# Patient Record
Sex: Female | Born: 1975
Health system: Southern US, Community
[De-identification: ages and names within clinical notes are randomized; demographics above are authoritative.]

## PROBLEM LIST (undated history)

## (undated) ENCOUNTER — Inpatient Hospital Stay (HOSPITAL_COMMUNITY): Payer: Self-pay

## (undated) DIAGNOSIS — F410 Panic disorder [episodic paroxysmal anxiety] without agoraphobia: Secondary | ICD-10-CM

## (undated) DIAGNOSIS — R51 Headache: Secondary | ICD-10-CM

## (undated) DIAGNOSIS — Z789 Other specified health status: Secondary | ICD-10-CM

## (undated) DIAGNOSIS — R519 Headache, unspecified: Secondary | ICD-10-CM

## (undated) HISTORY — PX: OTHER SURGICAL HISTORY: SHX169

## (undated) HISTORY — DX: Other specified health status: Z78.9

---

## 1998-03-31 ENCOUNTER — Other Ambulatory Visit: Admission: RE | Admit: 1998-03-31 | Discharge: 1998-03-31 | Payer: Self-pay | Admitting: Obstetrics and Gynecology

## 1998-06-05 ENCOUNTER — Other Ambulatory Visit: Admission: RE | Admit: 1998-06-05 | Discharge: 1998-06-05 | Payer: Self-pay | Admitting: Obstetrics and Gynecology

## 1999-08-25 ENCOUNTER — Other Ambulatory Visit: Admission: RE | Admit: 1999-08-25 | Discharge: 1999-08-25 | Payer: Self-pay | Admitting: Obstetrics and Gynecology

## 2000-01-28 ENCOUNTER — Encounter: Admission: RE | Admit: 2000-01-28 | Discharge: 2000-01-28 | Payer: Self-pay | Admitting: Family Medicine

## 2000-01-28 ENCOUNTER — Encounter: Payer: Self-pay | Admitting: Family Medicine

## 2000-10-28 ENCOUNTER — Other Ambulatory Visit: Admission: RE | Admit: 2000-10-28 | Discharge: 2000-10-28 | Payer: Self-pay | Admitting: Obstetrics and Gynecology

## 2001-07-25 ENCOUNTER — Encounter: Payer: Self-pay | Admitting: Family Medicine

## 2001-07-25 ENCOUNTER — Encounter: Admission: RE | Admit: 2001-07-25 | Discharge: 2001-07-25 | Payer: Self-pay | Admitting: Family Medicine

## 2001-11-14 ENCOUNTER — Other Ambulatory Visit: Admission: RE | Admit: 2001-11-14 | Discharge: 2001-11-14 | Payer: Self-pay | Admitting: Obstetrics and Gynecology

## 2003-11-14 ENCOUNTER — Encounter: Admission: RE | Admit: 2003-11-14 | Discharge: 2003-11-14 | Payer: Self-pay | Admitting: Family Medicine

## 2015-09-17 LAB — OB RESULTS CONSOLE RUBELLA ANTIBODY, IGM: Rubella: IMMUNE

## 2015-09-17 LAB — OB RESULTS CONSOLE GC/CHLAMYDIA
Chlamydia: NEGATIVE
Gonorrhea: NEGATIVE

## 2015-09-17 LAB — OB RESULTS CONSOLE HIV ANTIBODY (ROUTINE TESTING): HIV: NONREACTIVE

## 2015-09-17 LAB — OB RESULTS CONSOLE ANTIBODY SCREEN: ANTIBODY SCREEN: NEGATIVE

## 2015-09-17 LAB — OB RESULTS CONSOLE RPR: RPR: NONREACTIVE

## 2015-09-17 LAB — OB RESULTS CONSOLE ABO/RH: RH Type: POSITIVE

## 2015-09-17 LAB — OB RESULTS CONSOLE HEPATITIS B SURFACE ANTIGEN: Hepatitis B Surface Ag: NEGATIVE

## 2015-12-28 NOTE — L&D Delivery Note (Signed)
Patient was C/C/+1 labored down for an hour  and then pushed for 120 minutes with epidural.   NSVD  female infant, Apgars 9,9, weight P.   The patient had a midline second degree episiotomy done to allow for expedited delivery, repaired with  2-0 vicryl R. Fundus was firm. EBL was expected amount. Placenta was delivered intact. Vagina was clear.  Baby was vigorous and doing skin to skin with mother.  Gloria [name] A

## 2016-02-24 ENCOUNTER — Inpatient Hospital Stay (HOSPITAL_COMMUNITY): Admission: AD | Admit: 2016-02-24 | Payer: Self-pay | Source: Ambulatory Visit | Admitting: Obstetrics and Gynecology

## 2016-03-05 LAB — OB RESULTS CONSOLE GBS: GBS: POSITIVE

## 2016-03-22 ENCOUNTER — Telehealth (HOSPITAL_COMMUNITY): Payer: Self-pay | Admitting: *Deleted

## 2016-03-22 ENCOUNTER — Encounter (HOSPITAL_COMMUNITY): Payer: Self-pay | Admitting: *Deleted

## 2016-03-22 NOTE — Telephone Encounter (Signed)
Preadmission screen  

## 2016-03-23 ENCOUNTER — Telehealth (HOSPITAL_COMMUNITY): Payer: Self-pay | Admitting: *Deleted

## 2016-03-23 ENCOUNTER — Encounter (HOSPITAL_COMMUNITY): Payer: Self-pay | Admitting: *Deleted

## 2016-03-23 NOTE — Telephone Encounter (Signed)
Preadmission screen  

## 2016-03-26 ENCOUNTER — Encounter (HOSPITAL_COMMUNITY): Payer: Self-pay

## 2016-03-26 ENCOUNTER — Inpatient Hospital Stay (HOSPITAL_COMMUNITY): Payer: BLUE CROSS/BLUE SHIELD | Admitting: Anesthesiology

## 2016-03-26 ENCOUNTER — Inpatient Hospital Stay (HOSPITAL_COMMUNITY)
Admission: AD | Admit: 2016-03-26 | Discharge: 2016-03-28 | DRG: 775 | Disposition: A | Discharge status: Home / Self Care | Payer: BLUE CROSS/BLUE SHIELD | Source: Ambulatory Visit | Attending: Obstetrics and Gynecology | Admitting: Obstetrics and Gynecology

## 2016-03-26 DIAGNOSIS — Z3A39 39 weeks gestation of pregnancy: Secondary | ICD-10-CM

## 2016-03-26 DIAGNOSIS — O99824 Streptococcus B carrier state complicating childbirth: Secondary | ICD-10-CM | POA: Diagnosis present

## 2016-03-26 LAB — CBC
HEMATOCRIT: 39.9 % (ref 36.0–46.0)
HEMOGLOBIN: 14.3 g/dL (ref 12.0–15.0)
MCH: 33.1 pg (ref 26.0–34.0)
MCHC: 35.8 g/dL (ref 30.0–36.0)
MCV: 92.4 fL (ref 78.0–100.0)
Platelets: 224 10*3/uL (ref 150–400)
RBC: 4.32 MIL/uL (ref 3.87–5.11)
RDW: 13.4 % (ref 11.5–15.5)
WBC: 10.8 10*3/uL — ABNORMAL HIGH (ref 4.0–10.5)

## 2016-03-26 LAB — TYPE AND SCREEN
ABO/RH(D): A POS
Antibody Screen: NEGATIVE

## 2016-03-26 LAB — ABO/RH: ABO/RH(D): A POS

## 2016-03-26 MED ORDER — DIPHENHYDRAMINE HCL 50 MG/ML IJ SOLN: 12.5000 mg | INTRAMUSCULAR | Status: DC | PRN

## 2016-03-26 MED ORDER — PENICILLIN G POTASSIUM 5000000 UNITS IJ SOLR
5.0000 10*6.[IU] | Freq: Once | INTRAVENOUS | Status: AC
  Administered 2016-03-26: 5 10*6.[IU] via INTRAVENOUS
  Filled 2016-03-26: qty 5

## 2016-03-26 MED ORDER — PHENYLEPHRINE 40 MCG/ML (10ML) SYRINGE FOR IV PUSH (FOR BLOOD PRESSURE SUPPORT)
80.0000 ug | PREFILLED_SYRINGE | INTRAVENOUS | Status: DC | PRN
  Filled 2016-03-26: qty 2
  Filled 2016-03-26: qty 20

## 2016-03-26 MED ORDER — FENTANYL 2.5 MCG/ML BUPIVACAINE 1/10 % EPIDURAL INFUSION (WH - ANES)
14.0000 mL/h | INTRAMUSCULAR | Status: DC | PRN
  Administered 2016-03-26 (×3): 14 mL/h via EPIDURAL
  Filled 2016-03-26 (×2): qty 125

## 2016-03-26 MED ORDER — ONDANSETRON HCL 4 MG/2ML IJ SOLN: 4.0000 mg | Freq: Four times a day (QID) | INTRAMUSCULAR | Status: DC | PRN

## 2016-03-26 MED ORDER — CITRIC ACID-SODIUM CITRATE 334-500 MG/5ML PO SOLN
30.0000 mL | ORAL | Status: DC | PRN
  Administered 2016-03-26 (×2): 30 mL via ORAL
  Filled 2016-03-26 (×2): qty 15

## 2016-03-26 MED ORDER — PHENYLEPHRINE 40 MCG/ML (10ML) SYRINGE FOR IV PUSH (FOR BLOOD PRESSURE SUPPORT)
80.0000 ug | PREFILLED_SYRINGE | INTRAVENOUS | Status: DC | PRN
  Filled 2016-03-26: qty 2

## 2016-03-26 MED ORDER — TERBUTALINE SULFATE 1 MG/ML IJ SOLN: 0.2500 mg | Freq: Once | INTRAMUSCULAR | Status: DC | PRN

## 2016-03-26 MED ORDER — LACTATED RINGERS IV SOLN
500.0000 mL | Freq: Once | INTRAVENOUS | Status: AC
  Administered 2016-03-26: 1000 mL via INTRAVENOUS

## 2016-03-26 MED ORDER — LIDOCAINE HCL (PF) 1 % IJ SOLN
30.0000 mL | INTRAMUSCULAR | Status: DC | PRN
  Administered 2016-03-27: 30 mL via SUBCUTANEOUS
  Filled 2016-03-26: qty 30

## 2016-03-26 MED ORDER — LACTATED RINGERS IV SOLN
INTRAVENOUS | Status: DC
  Administered 2016-03-26 (×2): via INTRAVENOUS

## 2016-03-26 MED ORDER — EPHEDRINE 5 MG/ML INJ
10.0000 mg | INTRAVENOUS | Status: DC | PRN
  Filled 2016-03-26: qty 2

## 2016-03-26 MED ORDER — OXYTOCIN 10 UNIT/ML IJ SOLN
1.0000 m[IU]/min | INTRAVENOUS | Status: DC
  Administered 2016-03-26: 2 m[IU]/min via INTRAVENOUS
  Filled 2016-03-26: qty 4

## 2016-03-26 MED ORDER — OXYTOCIN 10 UNIT/ML IJ SOLN: 2.5000 [IU]/h | INTRAVENOUS | Status: DC

## 2016-03-26 MED ORDER — ACETAMINOPHEN 325 MG PO TABS: 650.0000 mg | ORAL_TABLET | ORAL | Status: DC | PRN

## 2016-03-26 MED ORDER — LIDOCAINE HCL (PF) 1 % IJ SOLN
INTRAMUSCULAR | Status: DC | PRN
  Administered 2016-03-26 (×2): 4 mL via EPIDURAL

## 2016-03-26 MED ORDER — LACTATED RINGERS IV SOLN: 500.0000 mL | INTRAVENOUS | Status: DC | PRN

## 2016-03-26 MED ORDER — PENICILLIN G POTASSIUM 5000000 UNITS IJ SOLR
2.5000 10*6.[IU] | INTRAVENOUS | Status: DC
  Administered 2016-03-26 (×3): 2.5 10*6.[IU] via INTRAVENOUS
  Filled 2016-03-26 (×7): qty 2.5

## 2016-03-26 MED ORDER — OXYTOCIN BOLUS FROM INFUSION
500.0000 mL | INTRAVENOUS | Status: DC
  Administered 2016-03-27: 500 mL via INTRAVENOUS

## 2016-03-26 NOTE — Anesthesia Procedure Notes (Signed)
Epidural Patient location during procedure: OB Start time: 03/26/2016 11:36 AM  Staffing Anesthesiologist: Mal AmabileFOSTER, Terresa Marlett Performed by: anesthesiologist   Preanesthetic Checklist Completed: patient identified, site marked, surgical consent, pre-op evaluation, timeout performed, IV checked, risks and benefits discussed and monitors and equipment checked  Epidural Patient position: sitting Prep: site prepped and draped and DuraPrep Patient monitoring: continuous pulse ox and blood pressure Approach: midline Location: L4-L5 Injection technique: LOR air  Needle:  Needle type: Tuohy  Needle gauge: 17 G Needle length: 9 cm and 9 Needle insertion depth: 6 cm Catheter type: closed end flexible Catheter size: 19 Gauge Catheter at skin depth: 11 cm Test dose: negative and Other  Assessment Events: blood not aspirated, injection not painful, no injection resistance, negative IV test and no paresthesia  Additional Notes Patient identified. Risks and benefits discussed including failed block, incomplete  Pain control, post dural puncture headache, nerve damage, paralysis, blood pressure Changes, nausea, vomiting, reactions to medications-both toxic and allergic and post Partum back pain. All questions were answered. Patient expressed understanding and wished to proceed. Sterile technique was used throughout procedure. Epidural site was Dressed with sterile barrier dressing. No paresthesias, signs of intravascular injection Or signs of intrathecal spread were encountered.  Patient was more comfortable after the epidural was dosed. Please see RN's note for documentation of vital signs and FHR which are stable.

## 2016-03-26 NOTE — H&P (Signed)
40 y.o. 2428w0d  G1P0 comes in for induction at term AMA with favorable cervix.  Otherwise has good fetal movement and no bleeding.  Past Medical History  Diagnosis Date  . Medical history non-contributory     Past Surgical History  Procedure Laterality Date  . Groin surgery      for cat scratch fever    OB History  Gravida Para Term Preterm AB SAB TAB Ectopic Multiple Living  1             # Outcome Date GA Lbr Len/2nd Weight Sex Delivery Anes PTL Lv  1 Current               Social History   Social History  . Marital Status: Married    Spouse Name: N/A  . Number of Children: N/A  . Years of Education: N/A   Occupational History  . Not on file.   Social History Main Topics  . Smoking status: Never Smoker   . Smokeless tobacco: Never Used  . Alcohol Use: No  . Drug Use: No  . Sexual Activity: Yes   Other Topics Concern  . Not on file   Social History Narrative   Hydrocodone    Prenatal Transfer Tool  Maternal Diabetes: No Genetic Screening: Normal- low risk Female NIPT Maternal Ultrasounds/Referrals: Abnormal:  Findings:   Fetal Kidney Anomalies- pyelectasis early resolved to 5.2 mm Fetal Ultrasounds or other Referrals:  None Maternal Substance Abuse:  No Significant Maternal Medications:  None Significant Maternal Lab Results: None  Other PNC: uncomplicated.    Filed Vitals:   03/26/16 0801  BP: 140/96  Pulse: 98  Temp: 98.1 F (36.7 C)  Resp: 20     Lungs/Cor:  NAD Abdomen:  soft, gravid Ex:  no cords, erythema SVE:  2/70/-2120 FHTs:  120s, good STV, NST R Toco:  q occ   A/P   AMA term induction.  GBS POS- PCN.  Sonny Anthes A

## 2016-03-26 NOTE — Anesthesia Preprocedure Evaluation (Signed)

## 2016-03-27 ENCOUNTER — Encounter (HOSPITAL_COMMUNITY): Payer: Self-pay

## 2016-03-27 LAB — RPR: RPR: NONREACTIVE

## 2016-03-27 LAB — CBC
HCT: 36.6 % (ref 36.0–46.0)
HEMOGLOBIN: 12.8 g/dL (ref 12.0–15.0)
MCH: 32.5 pg (ref 26.0–34.0)
MCHC: 35 g/dL (ref 30.0–36.0)
MCV: 92.9 fL (ref 78.0–100.0)
PLATELETS: 197 10*3/uL (ref 150–400)
RBC: 3.94 MIL/uL (ref 3.87–5.11)
RDW: 13.5 % (ref 11.5–15.5)
WBC: 22 10*3/uL — AB (ref 4.0–10.5)

## 2016-03-27 MED ORDER — METHYLERGONOVINE MALEATE 0.2 MG PO TABS: 0.2000 mg | ORAL_TABLET | ORAL | Status: DC | PRN

## 2016-03-27 MED ORDER — SODIUM CHLORIDE 0.9% FLUSH: 3.0000 mL | INTRAVENOUS | Status: DC | PRN

## 2016-03-27 MED ORDER — METHYLERGONOVINE MALEATE 0.2 MG/ML IJ SOLN: 0.2000 mg | INTRAMUSCULAR | Status: DC | PRN

## 2016-03-27 MED ORDER — BENZOCAINE-MENTHOL 20-0.5 % EX AERO
1.0000 "application " | INHALATION_SPRAY | CUTANEOUS | Status: DC | PRN
  Administered 2016-03-27: 1 via TOPICAL
  Filled 2016-03-27: qty 56

## 2016-03-27 MED ORDER — IBUPROFEN 800 MG PO TABS
800.0000 mg | ORAL_TABLET | Freq: Three times a day (TID) | ORAL | Status: DC
  Administered 2016-03-27 – 2016-03-28 (×4): 800 mg via ORAL
  Filled 2016-03-27 (×4): qty 1

## 2016-03-27 MED ORDER — SIMETHICONE 80 MG PO CHEW: 80.0000 mg | CHEWABLE_TABLET | ORAL | Status: DC | PRN

## 2016-03-27 MED ORDER — SENNOSIDES-DOCUSATE SODIUM 8.6-50 MG PO TABS
2.0000 | ORAL_TABLET | ORAL | Status: DC
  Administered 2016-03-27: 2 via ORAL
  Filled 2016-03-27: qty 2

## 2016-03-27 MED ORDER — DIPHENHYDRAMINE HCL 25 MG PO CAPS: 25.0000 mg | ORAL_CAPSULE | Freq: Four times a day (QID) | ORAL | Status: DC | PRN

## 2016-03-27 MED ORDER — MEASLES, MUMPS & RUBELLA VAC ~~LOC~~ INJ
0.5000 mL | INJECTION | Freq: Once | SUBCUTANEOUS | Status: DC
Start: 2016-03-28 — End: 2016-03-28
  Filled 2016-03-27: qty 0.5

## 2016-03-27 MED ORDER — PRENATAL MULTIVITAMIN CH
1.0000 | ORAL_TABLET | Freq: Every day | ORAL | Status: DC
  Administered 2016-03-27 – 2016-03-28 (×2): 1 via ORAL
  Filled 2016-03-27 (×2): qty 1

## 2016-03-27 MED ORDER — ONDANSETRON HCL 4 MG PO TABS: 4.0000 mg | ORAL_TABLET | ORAL | Status: DC | PRN

## 2016-03-27 MED ORDER — OXYCODONE-ACETAMINOPHEN 5-325 MG PO TABS: 2.0000 | ORAL_TABLET | ORAL | Status: DC | PRN

## 2016-03-27 MED ORDER — FERROUS SULFATE 325 (65 FE) MG PO TABS
325.0000 mg | ORAL_TABLET | Freq: Two times a day (BID) | ORAL | Status: DC
  Administered 2016-03-27 – 2016-03-28 (×3): 325 mg via ORAL
  Filled 2016-03-27 (×3): qty 1

## 2016-03-27 MED ORDER — OXYCODONE-ACETAMINOPHEN 5-325 MG PO TABS: 1.0000 | ORAL_TABLET | ORAL | Status: DC | PRN

## 2016-03-27 MED ORDER — SODIUM CHLORIDE 0.9% FLUSH
3.0000 mL | Freq: Two times a day (BID) | INTRAVENOUS | Status: DC
  Administered 2016-03-27: 3 mL via INTRAVENOUS

## 2016-03-27 MED ORDER — DIBUCAINE 1 % RE OINT: 1.0000 "application " | TOPICAL_OINTMENT | RECTAL | Status: DC | PRN

## 2016-03-27 MED ORDER — LANOLIN HYDROUS EX OINT: TOPICAL_OINTMENT | CUTANEOUS | Status: DC | PRN

## 2016-03-27 MED ORDER — ZOLPIDEM TARTRATE 5 MG PO TABS: 5.0000 mg | ORAL_TABLET | Freq: Every evening | ORAL | Status: DC | PRN

## 2016-03-27 MED ORDER — ACETAMINOPHEN 325 MG PO TABS: 650.0000 mg | ORAL_TABLET | ORAL | Status: DC | PRN

## 2016-03-27 MED ORDER — WITCH HAZEL-GLYCERIN EX PADS: 1.0000 "application " | MEDICATED_PAD | CUTANEOUS | Status: DC | PRN

## 2016-03-27 MED ORDER — SODIUM CHLORIDE 0.9 % IV SOLN: 250.0000 mL | INTRAVENOUS | Status: DC | PRN

## 2016-03-27 MED ORDER — MAGNESIUM HYDROXIDE 400 MG/5ML PO SUSP: 30.0000 mL | ORAL | Status: DC | PRN

## 2016-03-27 MED ORDER — ONDANSETRON HCL 4 MG/2ML IJ SOLN: 4.0000 mg | INTRAMUSCULAR | Status: DC | PRN

## 2016-03-27 MED ORDER — TETANUS-DIPHTH-ACELL PERTUSSIS 5-2.5-18.5 LF-MCG/0.5 IM SUSP: 0.5000 mL | Freq: Once | INTRAMUSCULAR | Status: DC

## 2016-03-27 NOTE — Anesthesia Postprocedure Evaluation (Signed)
Anesthesia Post Note  Patient: Gloria [name]  Procedure(s) Performed: * No procedures listed *  Patient location during evaluation: Mother Baby Anesthesia Type: Epidural Level of consciousness: awake and alert and oriented Pain management: satisfactory to patient Vital Signs Assessment: post-procedure vital signs reviewed and stable Respiratory status: spontaneous breathing and nonlabored ventilation Cardiovascular status: stable Postop Assessment: no headache, no backache, no signs of nausea or vomiting, adequate PO intake and patient able to bend at knees (patient up walking) Anesthetic complications: no    Last Vitals:  Filed Vitals:   03/27/16 0308 03/27/16 0422  BP: 137/73 124/75  Pulse: 79 93  Temp: 36.7 C 37.2 C  Resp: 18 20    Last Pain:  Filed Vitals:   03/27/16 0428  PainSc: 0-No pain                 Davari Lopes

## 2016-03-27 NOTE — Discharge Summary (Signed)
Obstetric Discharge Summary Reason for Admission: induction of labor Prenatal Procedures: NST Intrapartum Procedures: spontaneous vaginal delivery Postpartum Procedures: none Complications-Operative and Postpartum: 2 degree perineal laceration HEMOGLOBIN  Date Value Ref Range Status  03/27/2016 12.8 12.0 - 15.0 g/dL Final   HCT  Date Value Ref Range Status  03/27/2016 36.6 36.0 - 46.0 % Final     Discharge Diagnoses: Term Pregnancy-delivered  Discharge Information: Date: 03/27/2016 Activity: pelvic rest Diet: routine Medications: Ibuprofen Condition: stable Instructions: refer to practice specific booklet Discharge to: home Follow-up Information    Follow up with Gloria Kilbride A, MD. Schedule an appointment as soon as possible for [name] visit in 4 weeks.   Specialty:  Obstetrics and Gynecology   Contact information:   [address] [place] [id number] [phone]       Newborn Data: Live born female  Birth Weight: 6 lb 14.2 oz (3125 g) APGAR: 9, 9  Home with mother.  Gloria [name] 03/27/2016, 9:51 AM

## 2016-03-27 NOTE — Lactation Note (Signed)
This note was copied from a baby's chart. Lactation Consultation Note  Patient Name: Boy Hassan Rowaniffany Morash WGNFA'OToday's Date: 03/27/2016 Reason for consult: Follow-up assessment Mom trying to latch baby in cradle hold but baby not obtaining good depth. LC assisted Mom with positioning in football hold, demonstrated how to use breast compression to help with latch. Baby developed a good suckling pattern with some swallowing motions noted. Mom denied discomfort with latch. Stressed to WES[employer] InternationalMom the importance of using pillows to support baby and keep close to breast, supporting breasts for baby to sustain good depth once latched to breasts.  Basic teaching reviewed. Advised to call for assist with latch if baby not obtaining good depth.   Maternal Data Has patient been taught Hand Expression?: Yes (Mom reports RN demonstrated hand expression) Does the patient have breastfeeding experience prior to this delivery?: No  Feeding Feeding Type: Breast Fed  LATCH Score/Interventions Latch: Repeated attempts needed to sustain latch, nipple held in mouth throughout feeding, stimulation needed to elicit sucking reflex. Intervention(s): Adjust position;Assist with latch;Breast massage;Breast compression  Audible Swallowing: A few with stimulation  Type of Nipple: Everted at rest and after stimulation (short nipple shafts bilateral, almost flat)  Comfort (Breast/Nipple): Soft / non-tender     Hold (Positioning): Assistance needed to correctly position infant at breast and maintain latch. Intervention(s): Breastfeeding basics reviewed;Support Pillows;Position options;Skin to skin  LATCH Score: 7  Lactation Tools Discussed/Used WIC Program: No   Consult Status Consult Status: Follow-up Date: 03/28/16 Follow-up type: In-patient    Alfred LevinsGranger, Zaraya Delauder Ann 03/27/2016, 5:42 PM

## 2016-03-27 NOTE — Lactation Note (Signed)
This note was copied from a baby's chart. Lactation Consultation Note  Patient Name: Gloria [name] Reason for consult: Initial assessment Mom reports she feels BF is going well so far. Basic teaching reviewed, encouraged to BF with feeding ques. Lactation brochure left for review, advised of OP services and support group. Encouraged to call with next feeding for latch check.   Maternal Data Has patient been taught Hand Expression?: Yes (Mom reports RN demonstrated hand expression) Does the patient have breastfeeding experience prior to this delivery?: No  Feeding Feeding Type: Breast Fed Length of feed: 25 min  LATCH Score/Interventions                      Lactation Tools Discussed/Used WIC Program: No   Consult Status Consult Status: Follow-up Date: 03/28/16 Follow-up type: In-patient    Gloria [name], Gloria [name] [name], 2:21 PM

## 2016-03-27 NOTE — Progress Notes (Signed)
Patient is eating, ambulating, voiding.  Pain control is good.  Filed Vitals:   03/27/16 0247 03/27/16 0308 03/27/16 0422 03/27/16 0830  BP: 114/76 137/73 124/75 138/74  Pulse: 95 79 93 84  Temp:  98.1 F (36.7 C) 98.9 F (37.2 C) 97.7 F (36.5 C)  TempSrc:  Oral Oral Oral  Resp: 20 18 20 18   Height:      Weight:      SpO2:  98% 97% 99%    Fundus firm Perineum without swelling.  Lab Results  Component Value Date   WBC 22.0* 03/27/2016   HGB 12.8 03/27/2016   HCT 36.6 03/27/2016   MCV 92.9 03/27/2016   PLT 197 03/27/2016    --/--/A POS, A POS (03/31 0845)/RI  A/P Post partum day 0.  Routine care.  Expect d/c routine.    Rexford Prevo A

## 2016-03-28 NOTE — Lactation Note (Signed)
This note was copied from a baby's chart. Lactation Consultation Note  Patient Name: Boy Hassan Rowaniffany Hinkle ZOXWR'UToday's Date: 03/28/2016 Reason for consult: Follow-up assessment   Follow up with first time mom of 32 hours of age. Infant with 10 BF for 10-40 minutes, 6 voids and 5 stools in last 24 hours. Infant weight 6 lb 8.2 oz with weight loss of 5% since birth.   Infant currently having circumcision. Discussed that infant may be sleepy today and to encourage him to feed every 2-3 hours if he is not cueing. Enc mom to continue feeding 8-12 x in 24 hours at first feeding cues. Mom denies nipple pain and says her breast feel a little heavier today.  Parents report they may be d/c later today. Reviewed all BF information in Taking Care of Baby and Me Booklet. Reviewed Engorgement prevention/treatment/comfort pumping. Mom has Medela PIS for home use. Reviewed I/O and enc parent to maintain feeding log and take to Ped visit. Infant to [facility] tomorrow.   Reviewed LC Brochure, mom aware of OP Services, BF Support Groups and LC phone #. Enc mom to call with questions/concerns prn. Parents without questions/concerns today.   Maternal Data Formula Feeding for Exclusion: No Does the patient have breastfeeding experience prior to this delivery?: No  Feeding Feeding Type: Breast Fed Length of feed: 10 min  LATCH Score/Interventions Latch: Repeated attempts needed to sustain latch, nipple held in mouth throughout feeding, stimulation needed to elicit sucking reflex. Intervention(s): Skin to skin  Audible Swallowing: Spontaneous and intermittent Intervention(s): Skin to skin  Type of Nipple: Everted at rest and after stimulation  Comfort (Breast/Nipple): Soft / non-tender     Hold (Positioning): No assistance needed to correctly position infant at breast.  LATCH Score: 9  Lactation Tools Discussed/Used Pump Review: Milk Storage;Setup, frequency, and cleaning   Consult  Status Consult Status: Complete Follow-up type: Call as needed    Ed BlalockSharon S Viraj Liby 03/28/2016, 10:07 AM

## 2016-03-28 NOTE — Progress Notes (Signed)
Patient is eating, ambulating, voiding.  Pain control is good.  Filed Vitals:   03/27/16 0830 03/27/16 1733 03/27/16 2137 03/28/16 0548  BP: 138/74 127/80 121/70 136/83  Pulse: 84 94 90 88  Temp: 97.7 F (36.5 C) 98.4 F (36.9 C) 98.4 F (36.9 C) 97.8 F (36.6 C)  TempSrc: Oral Oral Oral Oral  Resp: 18 18 18 18   Height:      Weight:      SpO2: 99% 98% 98% 99%    Fundus firm Perineum without swelling.  Lab Results  Component Value Date   WBC 22.0* 03/27/2016   HGB 12.8 03/27/2016   HCT 36.6 03/27/2016   MCV 92.9 03/27/2016   PLT 197 03/27/2016    --/--/A POS, A POS (03/31 0845)/RI  A/P Post partum day 1.  Routine care.  Expect d/c routine.    Isaac Lacson A

## 2016-12-27 NOTE — L&D Delivery Note (Signed)
Pt was admitted for induction. She was started on Pit.She had AROM with clear fluid. She progressed rapidly to C/C/+2. She had a SVD after one push. Baby delivered ROA. Nuchal cord x 1 Placenta-S/I . [facility]cc. [facility]

## 2017-02-02 LAB — OB RESULTS CONSOLE RPR: RPR: NONREACTIVE

## 2017-02-02 LAB — OB RESULTS CONSOLE GC/CHLAMYDIA
Chlamydia: NEGATIVE
GC PROBE AMP, GENITAL: NEGATIVE

## 2017-02-02 LAB — OB RESULTS CONSOLE ANTIBODY SCREEN: ANTIBODY SCREEN: NEGATIVE

## 2017-02-02 LAB — OB RESULTS CONSOLE HIV ANTIBODY (ROUTINE TESTING): HIV: NONREACTIVE

## 2017-02-02 LAB — OB RESULTS CONSOLE RUBELLA ANTIBODY, IGM: Rubella: UNDETERMINED

## 2017-02-02 LAB — OB RESULTS CONSOLE ABO/RH: RH Type: POSITIVE

## 2017-02-02 LAB — OB RESULTS CONSOLE HEPATITIS B SURFACE ANTIGEN: Hepatitis B Surface Ag: NEGATIVE

## 2017-03-31 ENCOUNTER — Emergency Department (HOSPITAL_COMMUNITY): Payer: Managed Care, Other (non HMO)

## 2017-03-31 ENCOUNTER — Observation Stay (HOSPITAL_COMMUNITY)
Admission: EM | Admit: 2017-03-31 | Discharge: 2017-04-01 | Disposition: A | Discharge status: Home / Self Care | Payer: Managed Care, Other (non HMO) | Attending: Obstetrics and Gynecology | Admitting: Obstetrics and Gynecology

## 2017-03-31 ENCOUNTER — Encounter (HOSPITAL_COMMUNITY): Payer: Self-pay | Admitting: *Deleted

## 2017-03-31 DIAGNOSIS — Z3A34 34 weeks gestation of pregnancy: Secondary | ICD-10-CM | POA: Diagnosis not present

## 2017-03-31 DIAGNOSIS — Y999 Unspecified external cause status: Secondary | ICD-10-CM | POA: Insufficient documentation

## 2017-03-31 DIAGNOSIS — Y92410 Unspecified street and highway as the place of occurrence of the external cause: Secondary | ICD-10-CM | POA: Insufficient documentation

## 2017-03-31 DIAGNOSIS — Y93I9 Activity, other involving external motion: Secondary | ICD-10-CM | POA: Diagnosis not present

## 2017-03-31 DIAGNOSIS — S8992XA Unspecified injury of left lower leg, initial encounter: Secondary | ICD-10-CM | POA: Diagnosis not present

## 2017-03-31 DIAGNOSIS — O9A213 Injury, poisoning and certain other consequences of external causes complicating pregnancy, third trimester: Principal | ICD-10-CM | POA: Insufficient documentation

## 2017-03-31 DIAGNOSIS — S1091XA Abrasion of unspecified part of neck, initial encounter: Secondary | ICD-10-CM | POA: Diagnosis not present

## 2017-03-31 LAB — COMPREHENSIVE METABOLIC PANEL
ALBUMIN: 2.9 g/dL — AB (ref 3.5–5.0)
ALK PHOS: 105 U/L (ref 38–126)
ALT: 16 U/L (ref 14–54)
AST: 20 U/L (ref 15–41)
Anion gap: 10 (ref 5–15)
CALCIUM: 9.1 mg/dL (ref 8.9–10.3)
CHLORIDE: 106 mmol/L (ref 101–111)
CO2: 19 mmol/L — AB (ref 22–32)
CREATININE: 0.46 mg/dL (ref 0.44–1.00)
GFR calc non Af Amer: >60 mL/min (ref >60)
GLUCOSE: 90 mg/dL (ref 65–99)
Potassium: 3.3 mmol/L — ABNORMAL LOW (ref 3.5–5.1)
Sodium: 135 mmol/L (ref 135–145)
Total Bilirubin: 0.5 mg/dL (ref 0.3–1.2)
Total Protein: 5.8 g/dL — ABNORMAL LOW (ref 6.5–8.1)

## 2017-03-31 LAB — CBC
HCT: 37.5 % (ref 36.0–46.0)
HEMOGLOBIN: 13 g/dL (ref 12.0–15.0)
MCH: 32.3 pg (ref 26.0–34.0)
MCHC: 34.7 g/dL (ref 30.0–36.0)
MCV: 93.1 fL (ref 78.0–100.0)
PLATELETS: 208 10*3/uL (ref 150–400)
RBC: 4.03 MIL/uL (ref 3.87–5.11)
RDW: 13.2 % (ref 11.5–15.5)
WBC: 13.5 10*3/uL — AB (ref 4.0–10.5)

## 2017-03-31 MED ORDER — LACTATED RINGERS IV BOLUS (SEPSIS)
1000.0000 mL | Freq: Once | INTRAVENOUS | Status: AC
  Administered 2017-03-31: 1000 mL via INTRAVENOUS

## 2017-03-31 MED ORDER — CALCIUM CARBONATE ANTACID 500 MG PO CHEW: 2.0000 | CHEWABLE_TABLET | ORAL | Status: DC | PRN

## 2017-03-31 MED ORDER — PRENATAL MULTIVITAMIN CH
1.0000 | ORAL_TABLET | Freq: Every day | ORAL | Status: DC
  Administered 2017-04-01: 1 via ORAL
  Filled 2017-03-31: qty 1

## 2017-03-31 MED ORDER — NIFEDIPINE 10 MG PO CAPS
10.0000 mg | ORAL_CAPSULE | Freq: Once | ORAL | Status: AC
  Administered 2017-03-31: 10 mg via ORAL

## 2017-03-31 MED ORDER — DOCUSATE SODIUM 100 MG PO CAPS
100.0000 mg | ORAL_CAPSULE | Freq: Every day | ORAL | Status: DC
  Administered 2017-04-01: 100 mg via ORAL
  Filled 2017-03-31: qty 1

## 2017-03-31 MED ORDER — ACETAMINOPHEN 325 MG PO TABS: 650.0000 mg | ORAL_TABLET | ORAL | Status: DC | PRN

## 2017-03-31 MED ORDER — ZOLPIDEM TARTRATE 5 MG PO TABS: 5.0000 mg | ORAL_TABLET | Freq: Every evening | ORAL | Status: DC | PRN

## 2017-03-31 MED ORDER — NIFEDIPINE 10 MG PO CAPS
10.0000 mg | ORAL_CAPSULE | Freq: Three times a day (TID) | ORAL | Status: DC
  Administered 2017-03-31 – 2017-04-01 (×5): 10 mg via ORAL
  Filled 2017-03-31 (×8): qty 1

## 2017-03-31 MED ORDER — NIFEDIPINE 10 MG PO CAPS: 10.0000 mg | ORAL_CAPSULE | Freq: Once | ORAL | Status: DC

## 2017-03-31 MED ORDER — SODIUM CHLORIDE 0.9 % IV BOLUS (SEPSIS): 1000.0000 mL | Freq: Once | INTRAVENOUS | Status: DC

## 2017-03-31 NOTE — ED Notes (Signed)
Pt denies feeling ucs.
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
## 2017-04-01 ENCOUNTER — Observation Stay (HOSPITAL_COMMUNITY): Payer: Managed Care, Other (non HMO)

## 2017-04-01 ENCOUNTER — Other Ambulatory Visit: Payer: Self-pay | Admitting: Obstetrics

## 2017-04-01 DIAGNOSIS — Z3A34 34 weeks gestation of pregnancy: Secondary | ICD-10-CM

## 2017-04-01 DIAGNOSIS — S8992XA Unspecified injury of left lower leg, initial encounter: Secondary | ICD-10-CM | POA: Diagnosis not present

## 2017-04-01 DIAGNOSIS — O9A213 Injury, poisoning and certain other consequences of external causes complicating pregnancy, third trimester: Secondary | ICD-10-CM

## 2017-04-01 DIAGNOSIS — S1091XA Abrasion of unspecified part of neck, initial encounter: Secondary | ICD-10-CM | POA: Diagnosis not present

## 2017-04-01 MED ORDER — NIFEDIPINE 10 MG PO CAPS
10.0000 mg | ORAL_CAPSULE | Freq: Once | ORAL | Status: AC
  Administered 2017-04-01: 10 mg via ORAL
  Filled 2017-04-01: qty 1

## 2017-04-01 MED ORDER — BETAMETHASONE SOD PHOS & ACET 6 (3-3) MG/ML IJ SUSP
12.0000 mg | INTRAMUSCULAR | Status: DC
  Administered 2017-04-01: 12 mg via INTRAMUSCULAR
  Filled 2017-04-01 (×2): qty 2

## 2017-04-01 MED ORDER — BACITRACIN-NEOMYCIN-POLYMYXIN 400-5-5000 EX OINT
TOPICAL_OINTMENT | Freq: Three times a day (TID) | CUTANEOUS | Status: DC
  Administered 2017-04-01: 1 via TOPICAL
  Filled 2017-04-01 (×4): qty 1

## 2017-04-01 MED ORDER — BETAMETHASONE SOD PHOS & ACET 6 (3-3) MG/ML IJ SUSP: 12.0000 mg | Freq: Once | INTRAMUSCULAR | Status: DC
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
## 2017-04-02 ENCOUNTER — Inpatient Hospital Stay (HOSPITAL_COMMUNITY)
Admission: AD | Admit: 2017-04-02 | Discharge: 2017-04-02 | Disposition: A | Discharge status: Home / Self Care | Payer: Managed Care, Other (non HMO) | Source: Ambulatory Visit | Attending: Obstetrics | Admitting: Obstetrics

## 2017-04-02 DIAGNOSIS — Z3490 Encounter for supervision of normal pregnancy, unspecified, unspecified trimester: Secondary | ICD-10-CM | POA: Insufficient documentation

## 2017-04-02 DIAGNOSIS — Z3A00 Weeks of gestation of pregnancy not specified: Secondary | ICD-10-CM | POA: Diagnosis not present

## 2017-04-02 MED ORDER — BETAMETHASONE SOD PHOS & ACET 6 (3-3) MG/ML IJ SUSP
12.0000 mg | Freq: Once | INTRAMUSCULAR | Status: AC
  Administered 2017-04-02: 12 mg via INTRAMUSCULAR
  Filled 2017-04-02: qty 2

## 2017-04-05 LAB — OB RESULTS CONSOLE GBS: STREP GROUP B AG: POSITIVE

## 2017-04-27 ENCOUNTER — Other Ambulatory Visit: Payer: Self-pay | Admitting: Obstetrics and Gynecology

## 2017-04-28 ENCOUNTER — Telehealth (HOSPITAL_COMMUNITY): Payer: Self-pay | Admitting: *Deleted

## 2017-04-28 NOTE — Telephone Encounter (Signed)
Preadmission screen  

## 2017-04-29 ENCOUNTER — Encounter (HOSPITAL_COMMUNITY): Payer: Self-pay | Admitting: *Deleted

## 2017-04-29 ENCOUNTER — Telehealth (HOSPITAL_COMMUNITY): Payer: Self-pay | Admitting: *Deleted

## 2017-04-29 NOTE — Telephone Encounter (Signed)
Preadmission screen  

## 2017-05-02 ENCOUNTER — Inpatient Hospital Stay (HOSPITAL_COMMUNITY): Payer: Managed Care, Other (non HMO) | Admitting: Anesthesiology

## 2017-05-02 ENCOUNTER — Inpatient Hospital Stay (HOSPITAL_COMMUNITY)
Admission: RE | Admit: 2017-05-02 | Discharge: 2017-05-03 | DRG: 775 | Disposition: A | Discharge status: Home / Self Care | Payer: Managed Care, Other (non HMO) | Source: Ambulatory Visit | Attending: Obstetrics and Gynecology | Admitting: Obstetrics and Gynecology

## 2017-05-02 ENCOUNTER — Encounter (HOSPITAL_COMMUNITY): Payer: Self-pay

## 2017-05-02 DIAGNOSIS — O09529 Supervision of elderly multigravida, unspecified trimester: Secondary | ICD-10-CM

## 2017-05-02 DIAGNOSIS — Z3493 Encounter for supervision of normal pregnancy, unspecified, third trimester: Secondary | ICD-10-CM | POA: Diagnosis present

## 2017-05-02 DIAGNOSIS — Z3A39 39 weeks gestation of pregnancy: Secondary | ICD-10-CM | POA: Diagnosis not present

## 2017-05-02 DIAGNOSIS — Z3490 Encounter for supervision of normal pregnancy, unspecified, unspecified trimester: Secondary | ICD-10-CM

## 2017-05-02 DIAGNOSIS — O99824 Streptococcus B carrier state complicating childbirth: Secondary | ICD-10-CM | POA: Diagnosis present

## 2017-05-02 LAB — CBC
HCT: 36.5 % (ref 36.0–46.0)
Hemoglobin: 12.7 g/dL (ref 12.0–15.0)
MCH: 32.1 pg (ref 26.0–34.0)
MCHC: 34.8 g/dL (ref 30.0–36.0)
MCV: 92.2 fL (ref 78.0–100.0)
Platelets: 219 10*3/uL (ref 150–400)
RBC: 3.96 MIL/uL (ref 3.87–5.11)
RDW: 13.2 % (ref 11.5–15.5)
WBC: 9.2 10*3/uL (ref 4.0–10.5)

## 2017-05-02 LAB — TYPE AND SCREEN
ABO/RH(D): A POS
Antibody Screen: NEGATIVE

## 2017-05-02 LAB — RPR: RPR Ser Ql: NONREACTIVE

## 2017-05-02 MED ORDER — OXYTOCIN 40 UNITS IN LACTATED RINGERS INFUSION - SIMPLE MED
2.5000 [IU]/h | INTRAVENOUS | Status: DC
  Administered 2017-05-02: 2.5 [IU]/h via INTRAVENOUS

## 2017-05-02 MED ORDER — PENICILLIN G POT IN DEXTROSE 60000 UNIT/ML IV SOLN
3.0000 10*6.[IU] | INTRAVENOUS | Status: DC
  Administered 2017-05-02 (×2): 3 10*6.[IU] via INTRAVENOUS
  Filled 2017-05-02 (×3): qty 50

## 2017-05-02 MED ORDER — BUPIVACAINE IN DEXTROSE 0.75-8.25 % IT SOLN
INTRATHECAL | Status: AC
  Filled 2017-05-02: qty 2

## 2017-05-02 MED ORDER — PENICILLIN G POTASSIUM 5000000 UNITS IJ SOLR
5.0000 10*6.[IU] | Freq: Once | INTRAVENOUS | Status: AC
  Administered 2017-05-02: 5 10*6.[IU] via INTRAVENOUS
  Filled 2017-05-02: qty 5

## 2017-05-02 MED ORDER — EPHEDRINE 5 MG/ML INJ
10.0000 mg | INTRAVENOUS | Status: DC | PRN
  Filled 2017-05-02: qty 2

## 2017-05-02 MED ORDER — LACTATED RINGERS IV SOLN: 500.0000 mL | INTRAVENOUS | Status: DC | PRN

## 2017-05-02 MED ORDER — TERBUTALINE SULFATE 1 MG/ML IJ SOLN
0.2500 mg | Freq: Once | INTRAMUSCULAR | Status: DC | PRN
  Filled 2017-05-02: qty 1

## 2017-05-02 MED ORDER — ONDANSETRON HCL 4 MG/2ML IJ SOLN: 4.0000 mg | INTRAMUSCULAR | Status: DC | PRN

## 2017-05-02 MED ORDER — IBUPROFEN 600 MG PO TABS
600.0000 mg | ORAL_TABLET | Freq: Four times a day (QID) | ORAL | Status: DC
  Administered 2017-05-02 – 2017-05-03 (×4): 600 mg via ORAL
  Filled 2017-05-02 (×3): qty 1

## 2017-05-02 MED ORDER — MISOPROSTOL 25 MCG QUARTER TABLET
25.0000 ug | ORAL_TABLET | ORAL | Status: DC | PRN
  Filled 2017-05-02: qty 1

## 2017-05-02 MED ORDER — LIDOCAINE HCL (PF) 1 % IJ SOLN
30.0000 mL | INTRAMUSCULAR | Status: DC | PRN
  Filled 2017-05-02: qty 30

## 2017-05-02 MED ORDER — SOD CITRATE-CITRIC ACID 500-334 MG/5ML PO SOLN: 30.0000 mL | ORAL | Status: DC | PRN

## 2017-05-02 MED ORDER — OXYTOCIN 40 UNITS IN LACTATED RINGERS INFUSION - SIMPLE MED
1.0000 m[IU]/min | INTRAVENOUS | Status: DC
  Administered 2017-05-02: 2 m[IU]/min via INTRAVENOUS
  Filled 2017-05-02: qty 1000

## 2017-05-02 MED ORDER — TETANUS-DIPHTH-ACELL PERTUSSIS 5-2.5-18.5 LF-MCG/0.5 IM SUSP: 0.5000 mL | Freq: Once | INTRAMUSCULAR | Status: DC

## 2017-05-02 MED ORDER — MEASLES, MUMPS & RUBELLA VAC ~~LOC~~ INJ
0.5000 mL | INJECTION | Freq: Once | SUBCUTANEOUS | Status: AC
  Administered 2017-05-03: 0.5 mL via SUBCUTANEOUS
  Filled 2017-05-02: qty 0.5

## 2017-05-02 MED ORDER — COCONUT OIL OIL: 1.0000 "application " | TOPICAL_OIL | Status: DC | PRN

## 2017-05-02 MED ORDER — BENZOCAINE-MENTHOL 20-0.5 % EX AERO: 1.0000 "application " | INHALATION_SPRAY | CUTANEOUS | Status: DC | PRN

## 2017-05-02 MED ORDER — PHENYLEPHRINE 40 MCG/ML (10ML) SYRINGE FOR IV PUSH (FOR BLOOD PRESSURE SUPPORT)
80.0000 ug | PREFILLED_SYRINGE | INTRAVENOUS | Status: DC | PRN
  Filled 2017-05-02: qty 5

## 2017-05-02 MED ORDER — DIBUCAINE 1 % RE OINT: 1.0000 "application " | TOPICAL_OINTMENT | RECTAL | Status: DC | PRN

## 2017-05-02 MED ORDER — LACTATED RINGERS IV SOLN
500.0000 mL | Freq: Once | INTRAVENOUS | Status: AC
  Administered 2017-05-02: 500 mL via INTRAVENOUS

## 2017-05-02 MED ORDER — DIPHENHYDRAMINE HCL 50 MG/ML IJ SOLN: 12.5000 mg | INTRAMUSCULAR | Status: DC | PRN

## 2017-05-02 MED ORDER — SENNOSIDES-DOCUSATE SODIUM 8.6-50 MG PO TABS
2.0000 | ORAL_TABLET | ORAL | Status: DC
  Administered 2017-05-03: 2 via ORAL
  Filled 2017-05-02: qty 2

## 2017-05-02 MED ORDER — WITCH HAZEL-GLYCERIN EX PADS: 1.0000 "application " | MEDICATED_PAD | CUTANEOUS | Status: DC | PRN

## 2017-05-02 MED ORDER — LACTATED RINGERS IV SOLN
INTRAVENOUS | Status: DC
  Administered 2017-05-02: 01:00:00 via INTRAVENOUS

## 2017-05-02 MED ORDER — ONDANSETRON HCL 4 MG PO TABS: 4.0000 mg | ORAL_TABLET | ORAL | Status: DC | PRN

## 2017-05-02 MED ORDER — FENTANYL 2.5 MCG/ML BUPIVACAINE 1/10 % EPIDURAL INFUSION (WH - ANES)
14.0000 mL/h | INTRAMUSCULAR | Status: DC | PRN
  Filled 2017-05-02: qty 100

## 2017-05-02 MED ORDER — ONDANSETRON HCL 4 MG/2ML IJ SOLN: 4.0000 mg | Freq: Four times a day (QID) | INTRAMUSCULAR | Status: DC | PRN

## 2017-05-02 MED ORDER — PHENYLEPHRINE 40 MCG/ML (10ML) SYRINGE FOR IV PUSH (FOR BLOOD PRESSURE SUPPORT)
80.0000 ug | PREFILLED_SYRINGE | INTRAVENOUS | Status: DC | PRN
  Filled 2017-05-02: qty 5
  Filled 2017-05-02: qty 10

## 2017-05-02 MED ORDER — ACETAMINOPHEN 325 MG PO TABS: 650.0000 mg | ORAL_TABLET | ORAL | Status: DC | PRN

## 2017-05-02 MED ORDER — OXYTOCIN BOLUS FROM INFUSION
500.0000 mL | Freq: Once | INTRAVENOUS | Status: AC
  Administered 2017-05-02: 500 mL via INTRAVENOUS

## 2017-05-02 MED ORDER — BUPIVACAINE IN DEXTROSE 0.75-8.25 % IT SOLN
INTRATHECAL | Status: DC | PRN
  Administered 2017-05-02: 1.2 mL via INTRATHECAL

## 2017-05-02 MED ORDER — SIMETHICONE 80 MG PO CHEW: 80.0000 mg | CHEWABLE_TABLET | ORAL | Status: DC | PRN

## 2017-05-02 MED ORDER — ZOLPIDEM TARTRATE 5 MG PO TABS: 5.0000 mg | ORAL_TABLET | Freq: Every evening | ORAL | Status: DC | PRN

## 2017-05-02 NOTE — Anesthesia Pain Management Evaluation Note (Signed)
  CRNA Pain Management Visit Note  Patient: Gloria [name], 41 y.o., female  "Hello I am a member of the anesthesia team at [facility]. We have an anesthesia team available at all times to provide care throughout the hospital, including epidural management and anesthesia for C-section. I don't know your plan for the delivery whether it a natural birth, water birth, IV sedation, nitrous supplementation, doula or epidural, but we want to meet your pain goals."   1.Was your pain managed to your expectations on prior hospitalizations?   Yes   2.What is your expectation for pain management during this hospitalization?     Epidural  3.How can we help you reach that goal? Epidural when ready.  Record the patient's initial score and the patient's pain goal.   Pain: 5  Pain Goal: 5 [facility] wants you to be able to say your pain was always managed very well.  Lakeidra Reliford L 05/02/2017

## 2017-05-02 NOTE — Anesthesia Postprocedure Evaluation (Signed)
Anesthesia Post Note  Patient: Gloria [name]  Procedure(s) Performed: * No procedures listed *  Patient location during evaluation: Mother Baby Anesthesia Type: Epidural Level of consciousness: awake and alert and oriented Pain management: satisfactory to patient Vital Signs Assessment: post-procedure vital signs reviewed and stable Respiratory status: spontaneous breathing and nonlabored ventilation Cardiovascular status: stable Postop Assessment: no headache, no backache, no signs of nausea or vomiting, adequate PO intake and patient able to bend at knees (patient up walking) Anesthetic complications: no        Last Vitals:  Vitals:   05/02/17 1245 05/02/17 1345  BP: 136/60 136/72  Pulse: 62 62  Resp: 18 16  Temp: 36.4 C 36.5 C    Last Pain:  Vitals:   05/02/17 1345  TempSrc: Oral  PainSc:    Pain Goal:                 Rewa Weissberg

## 2017-05-02 NOTE — Anesthesia Procedure Notes (Signed)
Spinal  Patient location during procedure: OR Start time: 05/02/2017 9:53 AM End time: 05/02/2017 9:55 AM Staffing Anesthesiologist: Leilani AbleHATCHETT, Kaito Schulenburg Performed: anesthesiologist  Preanesthetic Checklist Completed: patient identified, surgical consent, pre-op evaluation, timeout performed, IV checked, risks and benefits discussed and monitors and equipment checked Spinal Block Patient position: sitting Prep: site prepped and draped and DuraPrep Patient monitoring: heart rate, cardiac monitor, continuous pulse ox and blood pressure Approach: midline Location: L3-4 Injection technique: single-shot Needle Needle type: Pencan  Needle gauge: 24 G Needle length: 9 cm Needle insertion depth: 7 cm Assessment Sensory level: T10

## 2017-05-02 NOTE — Anesthesia Preprocedure Evaluation (Signed)
Anesthesia Evaluation  Patient identified by MRN, date of birth, ID band Patient awake    Reviewed: Allergy & Precautions, H&P , NPO status , Patient's Chart, lab work & pertinent test results  Airway Mallampati: I  TM Distance: >3 FB Neck ROM: full    Dental no notable dental hx.    Pulmonary neg pulmonary ROS,    Pulmonary exam normal        Cardiovascular negative cardio ROS Normal cardiovascular exam     Neuro/Psych negative neurological ROS  negative psych ROS   GI/Hepatic negative GI ROS, Neg liver ROS,   Endo/Other  negative endocrine ROS  Renal/GU negative Renal ROS  negative genitourinary   Musculoskeletal negative musculoskeletal ROS (+)   Abdominal (+) + obese,   Peds  Hematology negative hematology ROS (+)   Anesthesia Other Findings   Reproductive/Obstetrics (+) Pregnancy                             Anesthesia Physical Anesthesia Plan  ASA: II  Anesthesia Plan: Epidural   Post-op Pain Management:    Induction:   Airway Management Planned:   Additional Equipment:   Intra-op Plan:   Post-operative Plan:   Informed Consent: I have reviewed the patients History and Physical, chart, labs and discussed the procedure including the risks, benefits and alternatives for the proposed anesthesia with the patient or authorized representative who has indicated his/her understanding and acceptance.     Plan Discussed with:   Anesthesia Plan Comments:         Anesthesia Quick Evaluation  

## 2017-05-02 NOTE — H&P (Signed)
41 y.o. 7735w0d  G2P1001 comes in for induction AMA.  Otherwise has good fetal movement and no bleeding.  Past Medical History:  Diagnosis Date  . Medical history non-contributory     Past Surgical History:  Procedure Laterality Date  . groin surgery     for cat scratch fever    OB History  Gravida Para Term Preterm AB Living  2 1 1     1   SAB TAB Ectopic Multiple Live Births        0 1    # Outcome Date GA Lbr Len/2nd Weight Sex Delivery Anes PTL Lv  2 Current           1 Term 03/27/16 8021w1d / 03:43 6 lb 14.2 oz (3.125 kg) M Vag-Spont EPI, Local  LIV     Birth Comments: bruise noted to lower right base of head      Social History   Social History  . Marital status: Married    Spouse name: N/A  . Number of children: N/A  . Years of education: N/A   Occupational History  . Not on file.   Social History Main Topics  . Smoking status: Never Smoker  . Smokeless tobacco: Never Used  . Alcohol use No  . Drug use: No  . Sexual activity: Yes   Other Topics Concern  . Not on file   Social History Narrative  . No narrative on file   Hydrocodone    Prenatal Transfer Tool  Maternal Diabetes: No Genetic Screening: Declined- PNC too late for most screening Maternal Ultrasounds/Referrals: Normal Fetal Ultrasounds or other Referrals:  None Maternal Substance Abuse:  No Significant Maternal Medications:  None Significant Maternal Lab Results: None  Other [facility].    There were no vitals filed for this visit.   Lungs/Cor:  NAD Abdomen:  soft, gravid Ex:  no cords, erythema SVE:  3/20/-2 in office FHTs:  All normal in office, good STV, NST R Toco:  q occ   A/P   AMA induction at 39 weeks.  GBS POS- PCN.  Adam Sanjuan A

## 2017-05-03 NOTE — Discharge Summary (Signed)
Obstetric Discharge Summary Reason for Admission: induction of labor and AMA Prenatal Procedures: ultrasound Intrapartum Procedures: spontaneous vaginal delivery Postpartum Procedures: none Complications-Operative and Postpartum: none Hemoglobin  Date Value Ref Range Status  05/02/2017 12.7 12.0 - 15.0 g/dL Final   HCT  Date Value Ref Range Status  05/02/2017 36.5 36.0 - 46.0 % Final    Physical Exam:  General: alert and cooperative Lochia: appropriate Uterine Fundus: firm DVT Evaluation: No evidence of DVT seen on physical exam.  Discharge Diagnoses: Term Pregnancy-delivered  Discharge Information: Date: 05/03/2017 Activity: pelvic rest Diet: routine Medications: PNV and Ibuprofen Condition: stable Instructions: refer to practice specific booklet Discharge to: home Follow-up Information    Gloria [name], Gloria E, MD Follow up in 4 week(s).   Specialty:  Obstetrics and Gynecology Contact information: [address] [place] [id number] [phone]           Newborn Data: Live born female  Birth Weight: 6 lb 6.8 oz (2915 g) APGAR: 8, 9  Home with mother.  Gloria [name], [name] 05/03/2017, 12:25 PM

## 2017-07-27 ENCOUNTER — Other Ambulatory Visit (HOSPITAL_COMMUNITY): Payer: Self-pay | Admitting: Obstetrics and Gynecology

## 2017-07-27 NOTE — H&P (Signed)
41 y.o. yo [id number] desires permanent sterilization.  Past Medical History:  Diagnosis Date  . Medical history non-contributory    Past Surgical History:  Procedure Laterality Date  . groin surgery     for cat scratch fever    Social History   Social History  . Marital status: Married    Spouse name: N/Gloria [name]  . Number of children: N/Gloria [name]  . Years of education: N/Gloria [name]   Occupational History  . Not on file.   Social History Main Topics  . Smoking status: Never Smoker  . Smokeless tobacco: Never Used  . Alcohol use No  . Drug use: No  . Sexual activity: Yes   Other Topics Concern  . Not on file   Social History Narrative  . No narrative on file    No current facility-administered medications on file prior to encounter.    Current Outpatient Prescriptions on File Prior to Encounter  Medication Sig Dispense Refill  . calcium carbonate (TUMS - DOSED IN MG ELEMENTAL CALCIUM) 500 MG chewable tablet Chew 1 tablet by mouth daily as needed for indigestion or heartburn.    . Prenatal Vit-Fe Fumarate-FA (PRENATAL MULTIVITAMIN) TABS tablet Take 1 tablet by mouth daily at 12 noon.      Allergies  Allergen Reactions  . Hydrocodone Hives    @VITALS2 @  Lungs: clear to ascultation Cor:  RRR Abdomen:  soft, nontender, nondistended. Ex:  no cords, erythema Pelvic:  Normal EFG, normal s/s uterus  Gloria [name]:  For laparscopic filschie clip BTL.   P: All risks, benefits and alternatives d/w patient and she desires to proceed.  Patient will have SCDs during the operation.     Gloria Gloria [name]

## 2017-08-01 ENCOUNTER — Encounter (HOSPITAL_COMMUNITY): Payer: Self-pay

## 2017-08-03 ENCOUNTER — Ambulatory Visit (HOSPITAL_COMMUNITY): Payer: Managed Care, Other (non HMO) | Admitting: Anesthesiology

## 2017-08-03 ENCOUNTER — Encounter (HOSPITAL_COMMUNITY): Payer: Self-pay

## 2017-08-03 ENCOUNTER — Encounter (HOSPITAL_COMMUNITY)
Admission: RE | Disposition: A | Discharge status: Home / Self Care | Payer: Self-pay | Source: Ambulatory Visit | Attending: Obstetrics and Gynecology

## 2017-08-03 ENCOUNTER — Ambulatory Visit (HOSPITAL_COMMUNITY)
Admission: RE | Admit: 2017-08-03 | Discharge: 2017-08-03 | Disposition: A | Discharge status: Home / Self Care | Payer: Managed Care, Other (non HMO) | Source: Ambulatory Visit | Attending: Obstetrics and Gynecology | Admitting: Obstetrics and Gynecology

## 2017-08-03 DIAGNOSIS — Z885 Allergy status to narcotic agent status: Secondary | ICD-10-CM | POA: Diagnosis not present

## 2017-08-03 DIAGNOSIS — Z302 Encounter for sterilization: Secondary | ICD-10-CM | POA: Insufficient documentation

## 2017-08-03 DIAGNOSIS — F419 Anxiety disorder, unspecified: Secondary | ICD-10-CM | POA: Diagnosis not present

## 2017-08-03 HISTORY — DX: Headache, unspecified: R51.9

## 2017-08-03 HISTORY — DX: Panic disorder (episodic paroxysmal anxiety): F41.0

## 2017-08-03 HISTORY — PX: LAPAROSCOPIC TUBAL LIGATION: SHX1937

## 2017-08-03 HISTORY — DX: Headache: R51

## 2017-08-03 LAB — CBC
HCT: 47.2 % — ABNORMAL HIGH (ref 36.0–46.0)
Hemoglobin: 16 g/dL — ABNORMAL HIGH (ref 12.0–15.0)
MCH: 30.4 pg (ref 26.0–34.0)
MCHC: 33.9 g/dL (ref 30.0–36.0)
MCV: 89.7 fL (ref 78.0–100.0)
PLATELETS: 246 10*3/uL (ref 150–400)
RBC: 5.26 MIL/uL — ABNORMAL HIGH (ref 3.87–5.11)
RDW: 13.5 % (ref 11.5–15.5)
WBC: 8.9 10*3/uL (ref 4.0–10.5)

## 2017-08-03 LAB — TYPE AND SCREEN
ABO/RH(D): A POS
ANTIBODY SCREEN: NEGATIVE

## 2017-08-03 LAB — PREGNANCY, URINE: PREG TEST UR: NEGATIVE

## 2017-08-03 SURGERY — LIGATION, FALLOPIAN TUBE, LAPAROSCOPIC
Anesthesia: General | Site: Abdomen | Laterality: Bilateral

## 2017-08-03 MED ORDER — SCOPOLAMINE 1 MG/3DAYS TD PT72
1.0000 | MEDICATED_PATCH | Freq: Once | TRANSDERMAL | Status: DC
  Administered 2017-08-03: 1.5 mg via TRANSDERMAL

## 2017-08-03 MED ORDER — IBUPROFEN 800 MG PO TABS: 800.0000 mg | ORAL_TABLET | Freq: Three times a day (TID) | ORAL | Status: DC | PRN

## 2017-08-03 MED ORDER — SUCCINYLCHOLINE CHLORIDE 200 MG/10ML IV SOSY
PREFILLED_SYRINGE | INTRAVENOUS | Status: DC | PRN
  Administered 2017-08-03: 120 mg via INTRAVENOUS

## 2017-08-03 MED ORDER — SODIUM CHLORIDE 0.9 % IJ SOLN
INTRAMUSCULAR | Status: AC
  Filled 2017-08-03: qty 20

## 2017-08-03 MED ORDER — PROPOFOL 10 MG/ML IV BOLUS
INTRAVENOUS | Status: DC | PRN
  Administered 2017-08-03: 170 mg via INTRAVENOUS

## 2017-08-03 MED ORDER — SUCCINYLCHOLINE CHLORIDE 200 MG/10ML IV SOSY
PREFILLED_SYRINGE | INTRAVENOUS | Status: AC
  Filled 2017-08-03: qty 10

## 2017-08-03 MED ORDER — ROPIVACAINE HCL 5 MG/ML IJ SOLN
INTRAMUSCULAR | Status: AC
  Filled 2017-08-03: qty 30

## 2017-08-03 MED ORDER — FENTANYL CITRATE (PF) 250 MCG/5ML IJ SOLN
INTRAMUSCULAR | Status: DC | PRN
  Administered 2017-08-03: 150 ug via INTRAVENOUS

## 2017-08-03 MED ORDER — ONDANSETRON HCL 4 MG/2ML IJ SOLN
INTRAMUSCULAR | Status: DC | PRN
  Administered 2017-08-03: 4 mg via INTRAVENOUS

## 2017-08-03 MED ORDER — ONDANSETRON HCL 4 MG/2ML IJ SOLN
INTRAMUSCULAR | Status: AC
  Filled 2017-08-03: qty 2

## 2017-08-03 MED ORDER — LACTATED RINGERS IV SOLN
INTRAVENOUS | Status: DC
  Administered 2017-08-03: 125 mL/h via INTRAVENOUS
  Administered 2017-08-03: 13:00:00 via INTRAVENOUS

## 2017-08-03 MED ORDER — MIDAZOLAM HCL 2 MG/2ML IJ SOLN
INTRAMUSCULAR | Status: AC
  Filled 2017-08-03: qty 2

## 2017-08-03 MED ORDER — DEXAMETHASONE SODIUM PHOSPHATE 10 MG/ML IJ SOLN
INTRAMUSCULAR | Status: DC | PRN
  Administered 2017-08-03: 10 mg via INTRAVENOUS

## 2017-08-03 MED ORDER — LIDOCAINE 2% (20 MG/ML) 5 ML SYRINGE
INTRAMUSCULAR | Status: DC | PRN
  Administered 2017-08-03: 100 mg via INTRAVENOUS

## 2017-08-03 MED ORDER — PROMETHAZINE HCL 25 MG/ML IJ SOLN: 6.2500 mg | INTRAMUSCULAR | Status: DC | PRN

## 2017-08-03 MED ORDER — TRAMADOL HCL 50 MG PO TABS: 50.0000 mg | ORAL_TABLET | Freq: Four times a day (QID) | ORAL | 0 refills | Status: AC | PRN

## 2017-08-03 MED ORDER — PROPOFOL 10 MG/ML IV BOLUS
INTRAVENOUS | Status: AC
  Filled 2017-08-03: qty 20

## 2017-08-03 MED ORDER — FENTANYL CITRATE (PF) 100 MCG/2ML IJ SOLN
INTRAMUSCULAR | Status: AC
  Filled 2017-08-03: qty 2

## 2017-08-03 MED ORDER — ONDANSETRON HCL 4 MG/2ML IJ SOLN: 4.0000 mg | Freq: Four times a day (QID) | INTRAMUSCULAR | Status: DC | PRN

## 2017-08-03 MED ORDER — KETOROLAC TROMETHAMINE 30 MG/ML IJ SOLN
INTRAMUSCULAR | Status: AC
  Filled 2017-08-03: qty 1

## 2017-08-03 MED ORDER — SCOPOLAMINE 1 MG/3DAYS TD PT72
MEDICATED_PATCH | TRANSDERMAL | Status: AC
  Administered 2017-08-03: 1.5 mg via TRANSDERMAL
  Filled 2017-08-03: qty 1

## 2017-08-03 MED ORDER — ONDANSETRON HCL 4 MG PO TABS: 4.0000 mg | ORAL_TABLET | Freq: Four times a day (QID) | ORAL | Status: DC | PRN

## 2017-08-03 MED ORDER — TRAMADOL HCL 50 MG PO TABS
ORAL_TABLET | ORAL | Status: AC
  Filled 2017-08-03: qty 1

## 2017-08-03 MED ORDER — FENTANYL CITRATE (PF) 250 MCG/5ML IJ SOLN
INTRAMUSCULAR | Status: AC
  Filled 2017-08-03: qty 5

## 2017-08-03 MED ORDER — LACTATED RINGERS IV SOLN: INTRAVENOUS | Status: DC

## 2017-08-03 MED ORDER — FENTANYL CITRATE (PF) 100 MCG/2ML IJ SOLN
25.0000 ug | INTRAMUSCULAR | Status: DC | PRN
  Administered 2017-08-03: 25 ug via INTRAVENOUS
  Administered 2017-08-03: 50 ug via INTRAVENOUS

## 2017-08-03 MED ORDER — MIDAZOLAM HCL 5 MG/5ML IJ SOLN
INTRAMUSCULAR | Status: DC | PRN
  Administered 2017-08-03: 2 mg via INTRAVENOUS

## 2017-08-03 MED ORDER — SODIUM CHLORIDE 0.9 % IJ SOLN
INTRAMUSCULAR | Status: AC
  Filled 2017-08-03: qty 10

## 2017-08-03 MED ORDER — MENTHOL 3 MG MT LOZG
1.0000 | LOZENGE | OROMUCOSAL | Status: DC | PRN
  Filled 2017-08-03: qty 9

## 2017-08-03 MED ORDER — LIDOCAINE HCL (CARDIAC) 20 MG/ML IV SOLN
INTRAVENOUS | Status: AC
  Filled 2017-08-03: qty 5

## 2017-08-03 MED ORDER — KETOROLAC TROMETHAMINE 30 MG/ML IJ SOLN
30.0000 mg | Freq: Four times a day (QID) | INTRAMUSCULAR | Status: DC
  Administered 2017-08-03: 30 mg via INTRAVENOUS

## 2017-08-03 MED ORDER — DEXAMETHASONE SODIUM PHOSPHATE 10 MG/ML IJ SOLN
INTRAMUSCULAR | Status: AC
  Filled 2017-08-03: qty 1

## 2017-08-03 SURGICAL SUPPLY — 26 items
CATH ROBINSON RED A/P 16FR (CATHETERS) ×3 IMPLANT
CLIP FILSHIE TUBAL LIGA STRL (Clip) ×3 IMPLANT
CLOTH BEACON ORANGE TIMEOUT ST (SAFETY) ×3 IMPLANT
DERMABOND ADVANCED (GAUZE/BANDAGES/DRESSINGS) ×2
DERMABOND ADVANCED .7 DNX12 (GAUZE/BANDAGES/DRESSINGS) ×1 IMPLANT
DRSG OPSITE POSTOP 3X4 (GAUZE/BANDAGES/DRESSINGS) ×3 IMPLANT
DURAPREP 26ML APPLICATOR (WOUND CARE) ×3 IMPLANT
GLOVE BIO SURGEON STRL SZ7 (GLOVE) ×6 IMPLANT
GLOVE BIOGEL PI IND STRL 7.0 (GLOVE) ×1 IMPLANT
GLOVE BIOGEL PI INDICATOR 7.0 (GLOVE) ×2
GOWN STRL REUS W/TWL LRG LVL3 (GOWN DISPOSABLE) ×6 IMPLANT
NEEDLE INSUFFLATION 120MM (ENDOMECHANICALS) ×3 IMPLANT
PACK LAPAROSCOPY BASIN (CUSTOM PROCEDURE TRAY) ×3 IMPLANT
PACK TRENDGUARD 450 HYBRID PRO (MISCELLANEOUS) ×1 IMPLANT
PACK TRENDGUARD 600 HYBRD PROC (MISCELLANEOUS) IMPLANT
PROTECTOR NERVE ULNAR (MISCELLANEOUS) ×6 IMPLANT
SUT VIC AB 2-0 UR6 27 (SUTURE) ×3 IMPLANT
SUT VICRYL RAPIDE 3 0 (SUTURE) ×3 IMPLANT
SYR 50ML LL SCALE MARK (SYRINGE) ×3 IMPLANT
SYR 5ML LL (SYRINGE) ×3 IMPLANT
TOWEL OR 17X24 6PK STRL BLUE (TOWEL DISPOSABLE) ×6 IMPLANT
TRENDGUARD 450 HYBRID PRO PACK (MISCELLANEOUS) ×3
TRENDGUARD 600 HYBRID PROC PK (MISCELLANEOUS)
TROCAR OPTI TIP 5M 100M (ENDOMECHANICALS) IMPLANT
TROCAR XCEL DIL TIP R 11M (ENDOMECHANICALS) ×3 IMPLANT
WARMER LAPAROSCOPE (MISCELLANEOUS) ×3 IMPLANT

## 2017-08-03 NOTE — Discharge Instructions (Signed)
DISCHARGE INSTRUCTIONS: Laparoscopy  The following instructions have been prepared to help you care for yourself upon your return home today.  Wound care: . Do not get the incision wet for the first 24 hours. The incision should be kept clean and dry. . The Band-Aids or dressings may be removed the day after surgery. . Should the incision become sore, red, and swollen after the first week, check with your doctor.  Personal hygiene: . Shower the day after your procedure.  Activity and limitations: . Do NOT drive or operate any equipment today. . Do NOT lift anything more than 15 pounds for 2-3 weeks after surgery. . Do NOT rest in bed all day. . Walking is encouraged. Walk each day, starting slowly with 5-minute walks 3 or 4 times a day. Slowly increase the length of your walks. . Walk up and down stairs slowly. . Do NOT do strenuous activities, such as golfing, playing tennis, bowling, running, biking, weight lifting, gardening, mowing, or vacuuming for 2-4 weeks. Ask your doctor when it is okay to start.  Diet: Eat a light meal as desired this evening. You may resume your usual diet tomorrow.  Return to work: This is dependent on the type of work you do. For the most part you can return to a desk job within a week of surgery. If you are more active at work, please discuss this with your doctor.  What to expect after your surgery: You may have a slight burning sensation when you urinate on the first day. You may have a very small amount of blood in the urine. Expect to have a small amount of vaginal discharge/light bleeding for 1-2 weeks. It is not unusual to have abdominal soreness and bruising for up to 2 weeks. You may be tired and need more rest for about 1 week. You may experience shoulder pain for 24-72 hours. Lying flat in bed may relieve it.  Call your doctor for any of the following: . Develop a fever of 100.4 or greater . Inability to urinate 6 hours after discharge from  hospital . Severe pain not relieved by pain medications . Persistent of heavy bleeding at incision site . Redness or swelling around incision site after a week . Increasing nausea or vomiting  Patient Signature________________________________________ Nurse Signature_________________________________________     Post Anesthesia Home Care Instructions  Activity: Get plenty of rest for the remainder of the day. A responsible individual must stay with you for 24 hours following the procedure.  For the next 24 hours, DO NOT: -Drive a car -Operate machinery -Drink alcoholic beverages -Take any medication unless instructed by your physician -Make any legal decisions or sign important papers.  Meals: Start with liquid foods such as gelatin or soup. Progress to regular foods as tolerated. Avoid greasy, spicy, heavy foods. If nausea and/or vomiting occur, drink only clear liquids until the nausea and/or vomiting subsides. Call your physician if vomiting continues.  Special Instructions/Symptoms: Your throat may feel dry or sore from the anesthesia or the breathing tube placed in your throat during surgery. If this causes discomfort, gargle with warm salt water. The discomfort should disappear within 24 hours.  If you had a scopolamine patch placed behind your ear for the management of post- operative nausea and/or vomiting:  1. The medication in the patch is effective for 72 hours, after which it should be removed.  Wrap patch in a tissue and discard in the trash. Wash hands thoroughly with soap and water. 2. You   remove the patch earlier than 72 hours if you experience unpleasant side effects which may include dry mouth, dizziness or visual disturbances. 3. Avoid touching the patch. Wash your hands with soap and water after contact with the patch.   NO IBUPROFEN PRODUCTS (MOTRIN, ADVIL) OR ALEVE UNTIL 6:30PM TODAY.

## 2017-08-03 NOTE — Anesthesia Postprocedure Evaluation (Signed)
Anesthesia Post Note  Patient: Gloria [name]  Procedure(s) Performed: Procedure(s) (LRB): LAPAROSCOPIC TUBAL LIGATION (Bilateral)     Patient location during evaluation: PACU Anesthesia Type: General Level of consciousness: awake and alert Pain management: pain level controlled Vital Signs Assessment: post-procedure vital signs reviewed and stable Respiratory status: spontaneous breathing, nonlabored ventilation and respiratory function stable Cardiovascular status: blood pressure returned to baseline and stable Postop Assessment: no signs of nausea or vomiting Anesthetic complications: no    Last Vitals:  Vitals:   08/03/17 1225 08/03/17 1245  BP:  136/86  Pulse: 74 71  Resp: 15 11  Temp:      Last Pain:  Vitals:   08/03/17 1245  TempSrc:   PainSc: Asleep   Pain Goal: Patients Stated Pain Goal: 5 (08/03/17 1012)               Beryle Lathehomas E Brock

## 2017-08-03 NOTE — Progress Notes (Signed)
There has been no change in the patients history, status or exam since the history and physical.  Vitals:   08/03/17 1012  BP: 109/81  Pulse: 86  Resp: 16  Temp: 98.4 F (36.9 C)  TempSrc: Oral  SpO2: 98%  Weight: 168 lb (76.2 kg)  Height: 5\' 8"  (1.727 m)    Lab Results  Component Value Date   WBC 8.9 08/03/2017   HGB 16.0 (H) 08/03/2017   HCT 47.2 (H) 08/03/2017   MCV 89.7 08/03/2017   PLT 246 08/03/2017   Results for orders placed or performed during the hospital encounter of 08/03/17 (from the past 24 hour(s))  Type and screen     Status: None   Collection Time: 08/03/17  9:59 AM  Result Value Ref Range   ABO/RH(D) A POS    Antibody Screen NEG    Sample Expiration 08/06/2017   CBC     Status: Abnormal   Collection Time: 08/03/17  9:59 AM  Result Value Ref Range   WBC 8.9 4.0 - 10.5 K/uL   RBC 5.26 (H) 3.87 - 5.11 MIL/uL   Hemoglobin 16.0 (H) 12.0 - 15.0 g/dL   HCT 40.947.2 (H) 81.136.0 - 91.446.0 %   MCV 89.7 78.0 - 100.0 fL   MCH 30.4 26.0 - 34.0 pg   MCHC 33.9 30.0 - 36.0 g/dL   RDW 78.213.5 95.611.5 - 21.315.5 %   Platelets 246 150 - 400 K/uL  Pregnancy, urine     Status: None   Collection Time: 08/03/17 10:00 AM  Result Value Ref Range   Preg Test, Ur NEGATIVE NEGATIVE   Edison Wollschlager A

## 2017-08-03 NOTE — Anesthesia Procedure Notes (Signed)
Procedure Name: Intubation Date/Time: 08/03/2017 11:11 AM Performed by: Jhonnie GarnerMARSHALL, Stokely Jeancharles M Pre-anesthesia Checklist: Patient identified, Emergency Drugs available, Suction available and Patient being monitored Patient Re-evaluated:Patient Re-evaluated prior to induction Oxygen Delivery Method: Circle system utilized Preoxygenation: Pre-oxygenation with 100% oxygen Induction Type: IV induction Ventilation: Mask ventilation without difficulty Laryngoscope Size: Miller and 2 Grade View: Grade I Tube type: Oral Tube size: 7.0 mm Number of attempts: 1 Airway Equipment and Method: Stylet Placement Confirmation: ETT inserted through vocal cords under direct vision,  positive ETCO2 and breath sounds checked- equal and bilateral Secured at: 22 cm Tube secured with: Tape Dental Injury: Teeth and Oropharynx as per pre-operative assessment

## 2017-08-03 NOTE — Op Note (Signed)
08/03/2017  11:32 AM  PATIENT:  Gloria Ferrariiffany A Boerner  41 y.o. female  PRE-OPERATIVE DIAGNOSIS:  DESIRES STERILIZATION  POST-OPERATIVE DIAGNOSIS:  DESIRES STERILIZATION  PROCEDURE:  Procedure(s) with comments: LAPAROSCOPIC TUBAL LIGATION (Bilateral) - WITH FILSHIE CLIPS  SURGEON:  Surgeon(s) and Role:    * Carrington ClampHorvath, Tylyn Stankovich, MD - Primary  PHYSICIAN ASSISTANT:   ASSISTANTS: none   ANESTHESIA:   general  EBL:  Total I/O In: -  Out: 152 [Urine:150; Blood:2]  SPECIMEN:  No Specimen  DISPOSITION OF SPECIMEN:  PATHOLOGY  COUNTS:  YES  TOURNIQUET:  * No tourniquets in log *  DICTATION: .Note written in EPIC  PLAN OF CARE: Discharge to home after PACU  PATIENT DISPOSITION:  PACU - hemodynamically stable.   Delay start of Pharmacological VTE agent (>24hrs) due to surgical blood loss or risk of bleeding: not applicable  Meds: none  Complications:  none  Findings: Normal uterus, tubes and ovaries.  Appendix seen and appeared normal.  Technique  After adequate general endotracheal anesthesia was achieved, the patient was prepped and draped in the usual sterile fashion.  A 2 cm incision was made just below the umbilicus and the abdominal wall tented up.  The Veress needle was inserted at a 45 degree angle to the pelvis and no bowel contents or blood were aspirated.  The abdomen was insufflated and the 12 mm trocar placed without complication.  The operative scope was introduced and the above findings noted.  The filchie clip instrument was introduced and the tube were followed to their fimbriated ends bilaterally.  A filchie clip was placed on the isthmic portion of each tube and confirmed visually to be around the entire circumference of each tube.  After a quick inspection of the pelvis and liver edge, all instruments were removed and the abdomen was desufflated.  The 12 mm faschial incision was closed with a figure of eight stitch of 2-vicryl and the skin was closed with dermabond.   All instruments were removed from the vagina and the patient returned to the PACU in stable condition.  Zell Hylton A

## 2017-08-03 NOTE — Transfer of Care (Signed)
Immediate Anesthesia Transfer of Care Note  Patient: Gloria [name]  Procedure(s) Performed: Procedure(s) with comments: LAPAROSCOPIC TUBAL LIGATION (Bilateral) - WITH FILSHIE CLIPS  Patient Location: PACU  Anesthesia Type:General  Level of Consciousness: awake, alert  and oriented  Airway & Oxygen Therapy: Patient Spontanous Breathing and Patient connected to nasal cannula oxygen  Post-op Assessment: Report given to RN, Post -op Vital signs reviewed and stable and Patient moving all extremities  Post vital signs: Reviewed and stable  Last Vitals:  Vitals:   08/03/17 1012  BP: 109/81  Pulse: 86  Resp: 16  Temp: 36.9 C    Last Pain:  Vitals:   08/03/17 1012  TempSrc: Oral      Patients Stated Pain Goal: 5 (08/03/17 1012)  Complications: No apparent anesthesia complications

## 2017-08-03 NOTE — Anesthesia Preprocedure Evaluation (Addendum)
Anesthesia Evaluation  Patient identified by MRN, date of birth, ID band Patient awake    Reviewed: Allergy & Precautions, NPO status , Patient's Chart, lab work & pertinent test results  Airway Mallampati: I  TM Distance: >3 FB Neck ROM: Full    Dental no notable dental hx. (+) Dental Advisory Given   Pulmonary neg pulmonary ROS,    Pulmonary exam normal breath sounds clear to auscultation       Cardiovascular negative cardio ROS Normal cardiovascular exam Rhythm:Regular Rate:Normal     Neuro/Psych  Headaches, Anxiety    GI/Hepatic negative GI ROS, Neg liver ROS,   Endo/Other  negative endocrine ROS  Renal/GU negative Renal ROS  negative genitourinary   Musculoskeletal negative musculoskeletal ROS (+)   Abdominal   Peds negative pediatric ROS (+)  Hematology negative hematology ROS (+)   Anesthesia Other Findings   Reproductive/Obstetrics negative OB ROS                            Anesthesia Physical Anesthesia Plan  ASA: II  Anesthesia Plan: General   Post-op Pain Management:    Induction: Intravenous  PONV Risk Score and Plan: 2 and Ondansetron, Dexamethasone and Treatment may vary due to age or medical condition  Airway Management Planned: Oral ETT  Additional Equipment:   Intra-op Plan:   Post-operative Plan: Extubation in OR  Informed Consent: I have reviewed the patients History and Physical, chart, labs and discussed the procedure including the risks, benefits and alternatives for the proposed anesthesia with the patient or authorized representative who has indicated his/her understanding and acceptance.   Dental advisory given  Plan Discussed with: CRNA  Anesthesia Plan Comments:         Anesthesia Quick Evaluation

## 2017-08-03 NOTE — Brief Op Note (Signed)
08/03/2017  11:32 AM  PATIENT:  Gloria [name]  41 y.o. female  PRE-OPERATIVE DIAGNOSIS:  DESIRES STERILIZATION  POST-OPERATIVE DIAGNOSIS:  DESIRES STERILIZATION  PROCEDURE:  Procedure(s) with comments: LAPAROSCOPIC TUBAL LIGATION (Bilateral) - WITH FILSHIE CLIPS  SURGEON:  Surgeon(s) and Role:    * Skila Rollins, MD - Primary  PHYSICIAN ASSISTANT:   ASSISTANTS: none   ANESTHESIA:   general  EBL:  Total I/O In: -  Out: 152 [Urine:150; Blood:2]  SPECIMEN:  No Specimen  DISPOSITION OF SPECIMEN:  PATHOLOGY  COUNTS:  YES  TOURNIQUET:  * No tourniquets in log *  DICTATION: .Note written in EPIC  PLAN OF CARE: Discharge to home after PACU  PATIENT DISPOSITION:  PACU - hemodynamically stable.   Delay start of Pharmacological VTE agent (>24hrs) due to surgical blood loss or risk of bleeding: not applicable  Meds: none  Complications:  none  Findings: Normal uterus, tubes and ovaries.  Appendix seen and appeared normal.  Technique  After adequate general endotracheal anesthesia was achieved, the patient was prepped and draped in the usual sterile fashion.  A 2 cm incision was made just below the umbilicus and the abdominal wall tented up.  The Veress needle was inserted at a 45 degree angle to the pelvis and no bowel contents or blood were aspirated.  The abdomen was insufflated and the 12 mm trocar placed without complication.  The operative scope was introduced and the above findings noted.  The filchie clip instrument was introduced and the tube were followed to their fimbriated ends bilaterally.  A filchie clip was placed on the isthmic portion of each tube and confirmed visually to be around the entire circumference of each tube.  After a quick inspection of the pelvis and liver edge, all instruments were removed and the abdomen was desufflated.  The 12 mm faschial incision was closed with a figure of eight stitch of 2-vicryl and the skin was closed with dermabond.   All instruments were removed from the vagina and the patient returned to the PACU in stable condition.  Ellyson Rarick A     

## 2017-08-04 DIAGNOSIS — Z302 Encounter for sterilization: Secondary | ICD-10-CM | POA: Diagnosis not present

## 2017-08-04 NOTE — Progress Notes (Signed)
Patient given tramadol, 50 mg po at 1450. EPIC in downtime and MAR does not allow charting post date.

## 2017-08-24 ENCOUNTER — Encounter (HOSPITAL_COMMUNITY): Payer: Self-pay | Admitting: Obstetrics and Gynecology

## 2019-01-26 ENCOUNTER — Encounter (HOSPITAL_BASED_OUTPATIENT_CLINIC_OR_DEPARTMENT_OTHER): Payer: Self-pay | Admitting: *Deleted

## 2019-01-26 ENCOUNTER — Emergency Department (HOSPITAL_BASED_OUTPATIENT_CLINIC_OR_DEPARTMENT_OTHER)
Admission: EM | Admit: 2019-01-26 | Discharge: 2019-01-26 | Disposition: A | Discharge status: Home / Self Care | Payer: BLUE CROSS/BLUE SHIELD | Attending: Emergency Medicine | Admitting: Emergency Medicine

## 2019-01-26 ENCOUNTER — Emergency Department (HOSPITAL_BASED_OUTPATIENT_CLINIC_OR_DEPARTMENT_OTHER): Payer: BLUE CROSS/BLUE SHIELD

## 2019-01-26 ENCOUNTER — Other Ambulatory Visit: Payer: Self-pay

## 2019-01-26 DIAGNOSIS — I8001 Phlebitis and thrombophlebitis of superficial vessels of right lower extremity: Secondary | ICD-10-CM | POA: Diagnosis not present

## 2019-01-26 DIAGNOSIS — R2241 Localized swelling, mass and lump, right lower limb: Secondary | ICD-10-CM | POA: Insufficient documentation

## 2019-01-26 DIAGNOSIS — R600 Localized edema: Secondary | ICD-10-CM | POA: Diagnosis not present

## 2019-01-26 DIAGNOSIS — F17210 Nicotine dependence, cigarettes, uncomplicated: Secondary | ICD-10-CM | POA: Diagnosis not present

## 2019-01-26 DIAGNOSIS — M79651 Pain in right thigh: Secondary | ICD-10-CM | POA: Diagnosis not present

## 2019-01-26 LAB — BASIC METABOLIC PANEL
Anion gap: 7 (ref 5–15)
BUN: 7 mg/dL (ref 6–20)
CO2: 21 mmol/L — ABNORMAL LOW (ref 22–32)
CREATININE: 0.54 mg/dL (ref 0.44–1.00)
Calcium: 8.7 mg/dL — ABNORMAL LOW (ref 8.9–10.3)
Chloride: 106 mmol/L (ref 98–111)
GFR calc Af Amer: >60 mL/min (ref >60)
GFR calc non Af Amer: >60 mL/min (ref >60)
GLUCOSE: 101 mg/dL — AB (ref 70–99)
Potassium: 3.7 mmol/L (ref 3.5–5.1)
Sodium: 134 mmol/L — ABNORMAL LOW (ref 135–145)

## 2019-01-26 LAB — CBC
HEMATOCRIT: 44 % (ref 36.0–46.0)
Hemoglobin: 14.1 g/dL (ref 12.0–15.0)
MCH: 29.9 pg (ref 26.0–34.0)
MCHC: 32 g/dL (ref 30.0–36.0)
MCV: 93.4 fL (ref 80.0–100.0)
Platelets: UNDETERMINED 10*3/uL (ref 150–400)
RBC: 4.71 MIL/uL (ref 3.87–5.11)
RDW: 13.3 % (ref 11.5–15.5)
WBC: 6.4 10*3/uL (ref 4.0–10.5)
nRBC: 0 % (ref 0.0–0.2)

## 2019-01-26 LAB — PREGNANCY, URINE: Preg Test, Ur: NEGATIVE

## 2019-01-26 MED ORDER — RIVAROXABAN (XARELTO) EDUCATION KIT FOR DVT/PE PATIENTS
PACK | Freq: Once | Status: AC
Start: 2019-01-26 — End: 2019-01-26
  Administered 2019-01-26: 16:00:00

## 2019-01-26 MED ORDER — RIVAROXABAN 15 MG PO TABS
15.0000 mg | ORAL_TABLET | Freq: Once | ORAL | Status: AC
  Administered 2019-01-26: 15 mg via ORAL
  Filled 2019-01-26: qty 1

## 2019-01-26 MED ORDER — AMOXICILLIN-POT CLAVULANATE 875-125 MG PO TABS: 1.0000 | ORAL_TABLET | Freq: Two times a day (BID) | ORAL | 0 refills | Status: AC

## 2019-01-26 MED ORDER — RIVAROXABAN (XARELTO) VTE STARTER PACK (15 & 20 MG): ORAL_TABLET | ORAL | 0 refills | Status: AC

## 2019-01-26 MED FILL — XARELTO STARTER PACK: 15 & 20 | 30 days supply | Qty: 51 | Fill #0

## 2019-01-26 MED FILL — AMOX-CLAV 875-125 MG TABLET: 875-125 | 7 days supply | Qty: 14 | Fill #0

## 2019-01-26 NOTE — ED Notes (Signed)
Pharmacist at bedside for xarelto education.

## 2019-01-26 NOTE — ED Notes (Signed)
Pt enrolled in aromatherapy pain trial 

## 2019-01-26 NOTE — Discharge Instructions (Addendum)
For your superficial venous thrombosis, in the greater saphenous vein: - This is a small, superficial clot in one of the veins of your leg, the greater saphenous vein (GSV) - Apply a warm compress with gentle pressure to the area 3-4x daily for 1 week - Light compression with an ACE wrap or knee sleeve may help pain and irritation/inflammation - Take the antibiotic as prescribed  YOU WILL NEED AT LEAST 6 WEEKS OF BLOOD THINNER; CALL YOUR PRIMARY TO DISCUSS FURTHER TREATMENT

## 2019-01-26 NOTE — ED Notes (Signed)
Patient transported to Ultrasound 
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
## 2019-01-26 NOTE — ED Triage Notes (Signed)
Pt sent here from MD office for r/o DVt right thigh redness and swelling

## 2019-02-20 DIAGNOSIS — I8001 Phlebitis and thrombophlebitis of superficial vessels of right lower extremity: Secondary | ICD-10-CM | POA: Diagnosis not present

## 2019-07-13 DIAGNOSIS — Z0000 Encounter for general adult medical examination without abnormal findings: Secondary | ICD-10-CM | POA: Diagnosis not present

## 2019-07-13 DIAGNOSIS — F322 Major depressive disorder, single episode, severe without psychotic features: Secondary | ICD-10-CM | POA: Diagnosis not present

## 2019-07-13 DIAGNOSIS — Z720 Tobacco use: Secondary | ICD-10-CM | POA: Diagnosis not present

## 2019-07-13 DIAGNOSIS — F419 Anxiety disorder, unspecified: Secondary | ICD-10-CM | POA: Diagnosis not present

## 2019-07-16 DIAGNOSIS — Z0000 Encounter for general adult medical examination without abnormal findings: Secondary | ICD-10-CM | POA: Diagnosis not present

## 2019-07-16 DIAGNOSIS — F17210 Nicotine dependence, cigarettes, uncomplicated: Secondary | ICD-10-CM | POA: Diagnosis not present

## 2019-09-13 DIAGNOSIS — F322 Major depressive disorder, single episode, severe without psychotic features: Secondary | ICD-10-CM | POA: Diagnosis not present

## 2019-09-13 DIAGNOSIS — F419 Anxiety disorder, unspecified: Secondary | ICD-10-CM | POA: Diagnosis not present

## 2019-11-08 DIAGNOSIS — Z1159 Encounter for screening for other viral diseases: Secondary | ICD-10-CM | POA: Diagnosis not present

## 2020-02-08 DIAGNOSIS — Z01419 Encounter for gynecological examination (general) (routine) without abnormal findings: Secondary | ICD-10-CM | POA: Diagnosis not present

## 2020-02-08 DIAGNOSIS — Z6825 Body mass index (BMI) 25.0-25.9, adult: Secondary | ICD-10-CM | POA: Diagnosis not present

## 2020-02-08 DIAGNOSIS — Z1231 Encounter for screening mammogram for malignant neoplasm of breast: Secondary | ICD-10-CM | POA: Diagnosis not present

## 2020-02-08 DIAGNOSIS — Z124 Encounter for screening for malignant neoplasm of cervix: Secondary | ICD-10-CM | POA: Diagnosis not present

## 2020-02-12 ENCOUNTER — Other Ambulatory Visit: Payer: Self-pay | Admitting: Obstetrics and Gynecology

## 2020-02-12 DIAGNOSIS — R928 Other abnormal and inconclusive findings on diagnostic imaging of breast: Secondary | ICD-10-CM

## 2020-02-18 ENCOUNTER — Other Ambulatory Visit: Payer: Self-pay

## 2020-02-18 ENCOUNTER — Ambulatory Visit
Admission: RE | Admit: 2020-02-18 | Discharge: 2020-02-18 | Disposition: A | Discharge status: Home / Self Care | Payer: BLUE CROSS/BLUE SHIELD | Source: Ambulatory Visit | Attending: Obstetrics and Gynecology | Admitting: Obstetrics and Gynecology

## 2020-02-18 ENCOUNTER — Ambulatory Visit
Admission: RE | Admit: 2020-02-18 | Discharge: 2020-02-18 | Disposition: A | Discharge status: Home / Self Care | Payer: BC Managed Care – PPO | Source: Ambulatory Visit | Attending: Obstetrics and Gynecology | Admitting: Obstetrics and Gynecology

## 2020-02-18 ENCOUNTER — Other Ambulatory Visit: Payer: Self-pay | Admitting: Obstetrics and Gynecology

## 2020-02-18 DIAGNOSIS — R928 Other abnormal and inconclusive findings on diagnostic imaging of breast: Secondary | ICD-10-CM

## 2020-02-18 DIAGNOSIS — N6489 Other specified disorders of breast: Secondary | ICD-10-CM | POA: Diagnosis not present

## 2020-02-18 DIAGNOSIS — R922 Inconclusive mammogram: Secondary | ICD-10-CM | POA: Diagnosis not present

## 2020-02-18 DIAGNOSIS — N6320 Unspecified lump in the left breast, unspecified quadrant: Secondary | ICD-10-CM

## 2020-02-21 ENCOUNTER — Other Ambulatory Visit: Payer: Self-pay

## 2020-02-21 ENCOUNTER — Ambulatory Visit
Admission: RE | Admit: 2020-02-21 | Discharge: 2020-02-21 | Disposition: A | Discharge status: Home / Self Care | Payer: BC Managed Care – PPO | Source: Ambulatory Visit | Attending: Obstetrics and Gynecology | Admitting: Obstetrics and Gynecology

## 2020-02-21 ENCOUNTER — Other Ambulatory Visit: Payer: Self-pay | Admitting: Obstetrics and Gynecology

## 2020-02-21 DIAGNOSIS — N6320 Unspecified lump in the left breast, unspecified quadrant: Secondary | ICD-10-CM

## 2020-02-21 DIAGNOSIS — N6489 Other specified disorders of breast: Secondary | ICD-10-CM | POA: Diagnosis not present

## 2020-02-21 DIAGNOSIS — R928 Other abnormal and inconclusive findings on diagnostic imaging of breast: Secondary | ICD-10-CM | POA: Diagnosis not present

## 2020-02-21 DIAGNOSIS — N6012 Diffuse cystic mastopathy of left breast: Secondary | ICD-10-CM | POA: Diagnosis not present

## 2020-02-25 ENCOUNTER — Other Ambulatory Visit: Payer: BLUE CROSS/BLUE SHIELD

## 2020-04-04 DIAGNOSIS — F17200 Nicotine dependence, unspecified, uncomplicated: Secondary | ICD-10-CM | POA: Diagnosis not present

## 2020-04-04 DIAGNOSIS — F339 Major depressive disorder, recurrent, unspecified: Secondary | ICD-10-CM | POA: Diagnosis not present

## 2020-04-04 DIAGNOSIS — F419 Anxiety disorder, unspecified: Secondary | ICD-10-CM | POA: Diagnosis not present

## 2020-10-22 DIAGNOSIS — Z0000 Encounter for general adult medical examination without abnormal findings: Secondary | ICD-10-CM | POA: Diagnosis not present

## 2020-10-22 DIAGNOSIS — Z13220 Encounter for screening for lipoid disorders: Secondary | ICD-10-CM | POA: Diagnosis not present

## 2020-10-22 DIAGNOSIS — Z8349 Family history of other endocrine, nutritional and metabolic diseases: Secondary | ICD-10-CM | POA: Diagnosis not present

## 2020-10-22 DIAGNOSIS — F419 Anxiety disorder, unspecified: Secondary | ICD-10-CM | POA: Diagnosis not present

## 2021-03-26 DIAGNOSIS — R300 Dysuria: Secondary | ICD-10-CM | POA: Diagnosis not present

## 2021-04-15 IMAGING — MG MM DIGITAL DIAGNOSTIC UNILAT*L* W/ TOMO W/ CAD
6 series · 6 of 18 positions shown · non-contrast
Comparison: Previous exam(s).

CLINICAL DATA: Patient was called back from screening mammogram for
a possible asymmetry in the left breast. Patient has lost 45 pounds.
Patient's sister was diagnosed with breast cancer at the age of 47.

EXAM:
DIGITAL DIAGNOSTIC LEFT MAMMOGRAM WITH CAD AND TOMO
ULTRASOUND LEFT BREAST

[L MLO synth-2D (1 of 2)]
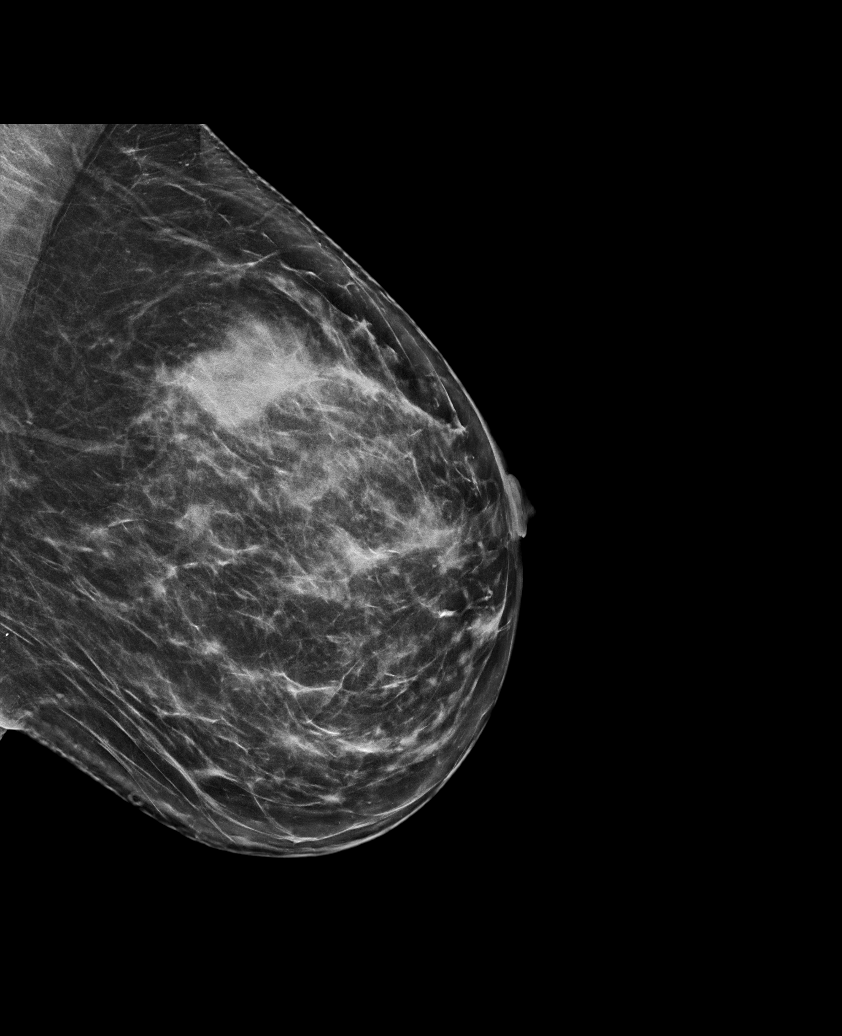

[L MLO synth-2D (2 of 2)]
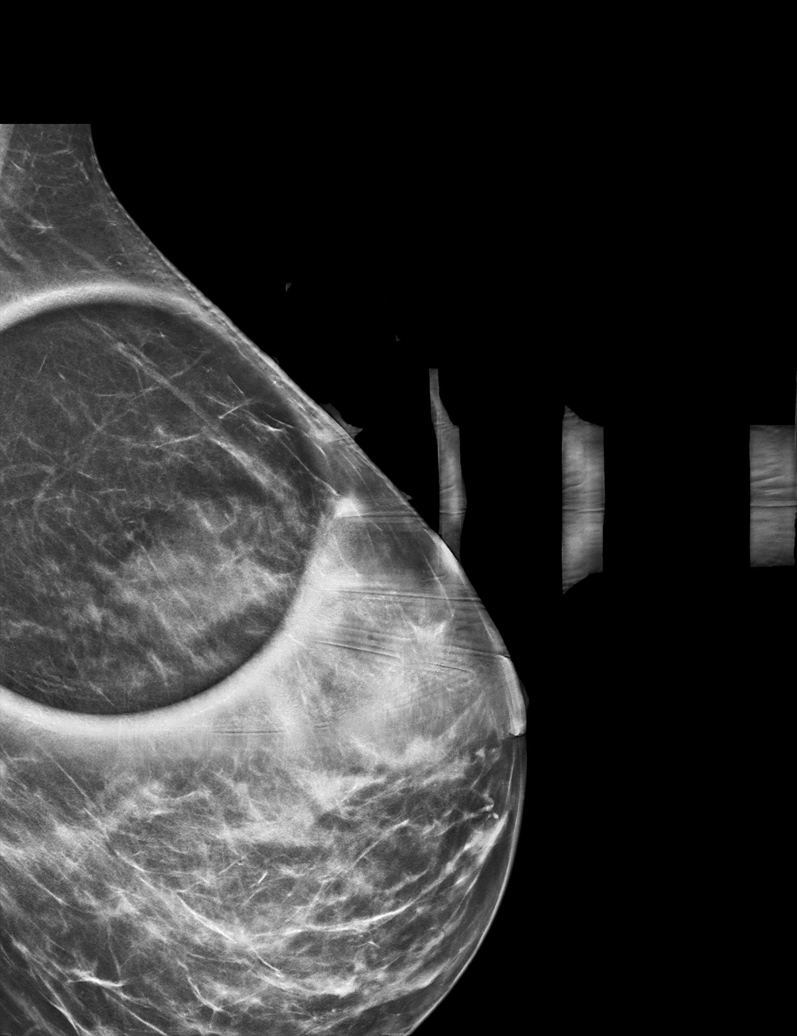

[L ML synth-2D]
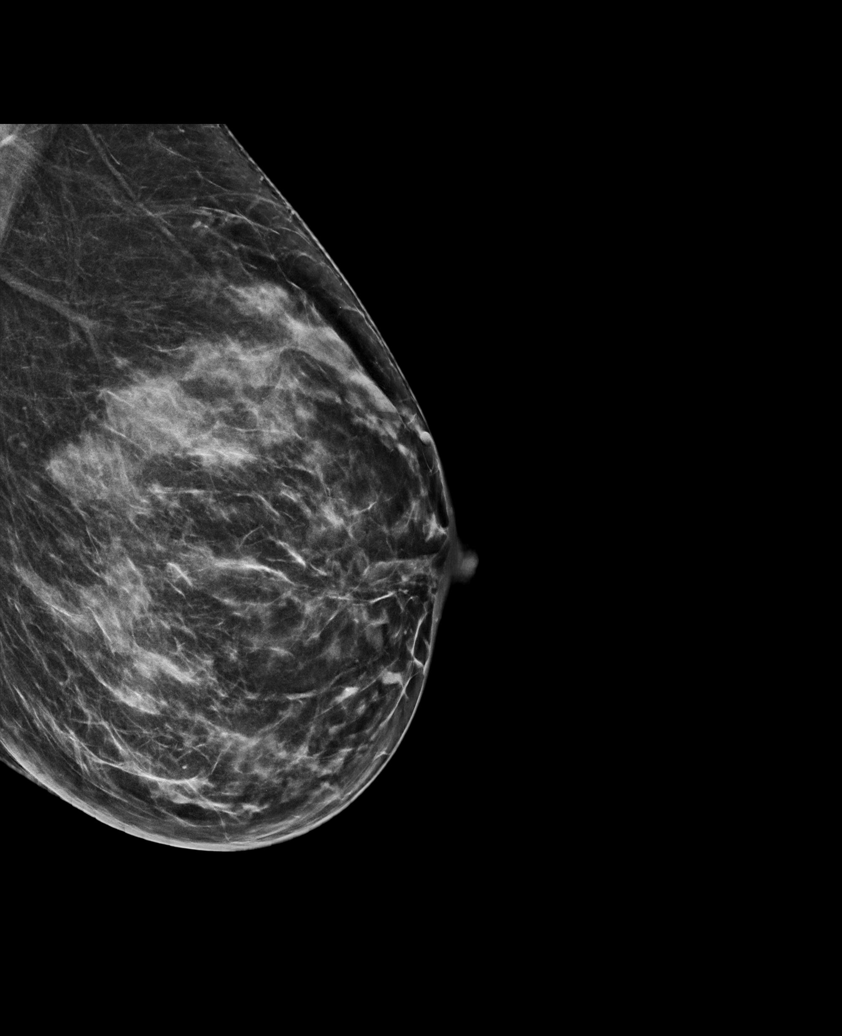

[L MLO tomo (1 of 2) · tomo slice 37/74.0]
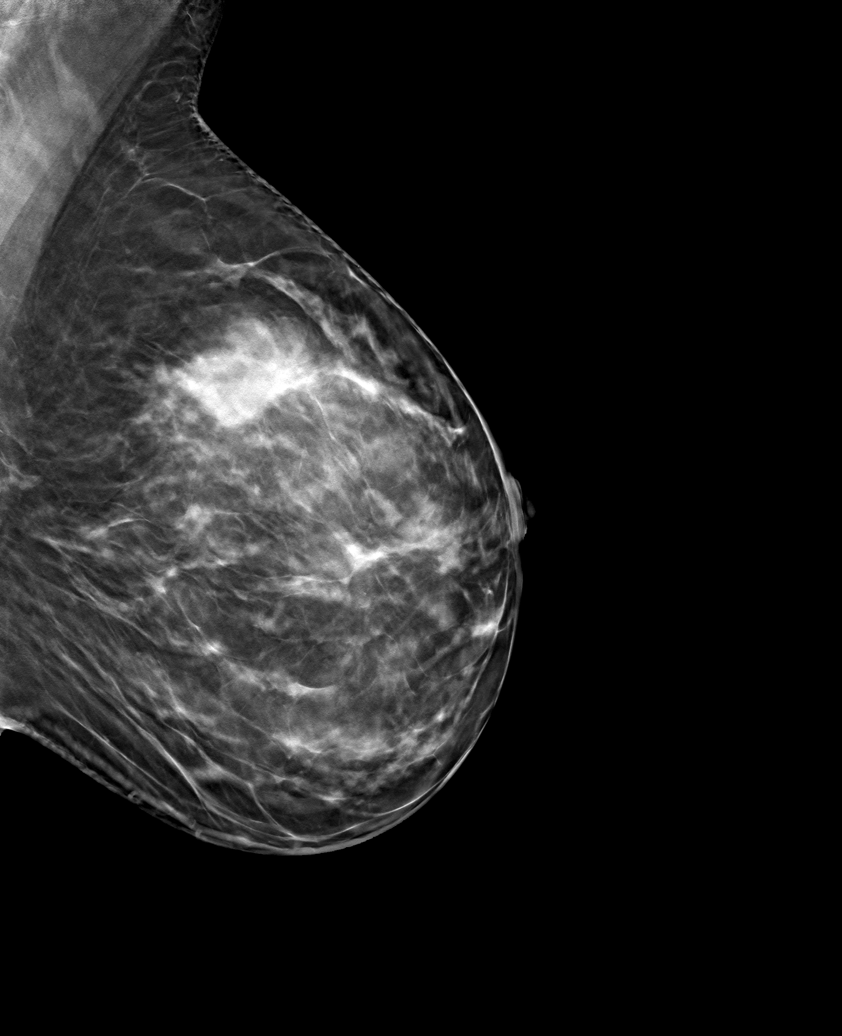

[L ML tomo · tomo slice 36/71.0]
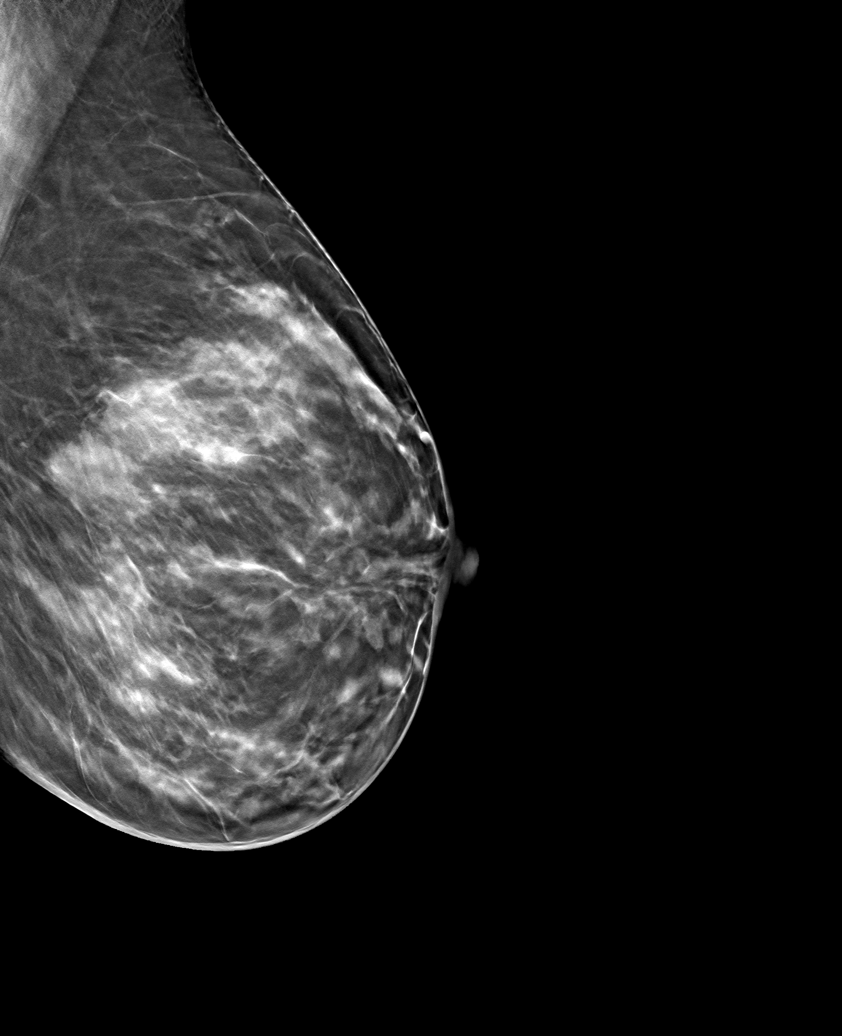

[L MLO tomo (2 of 2) · tomo slice 34/67.0]
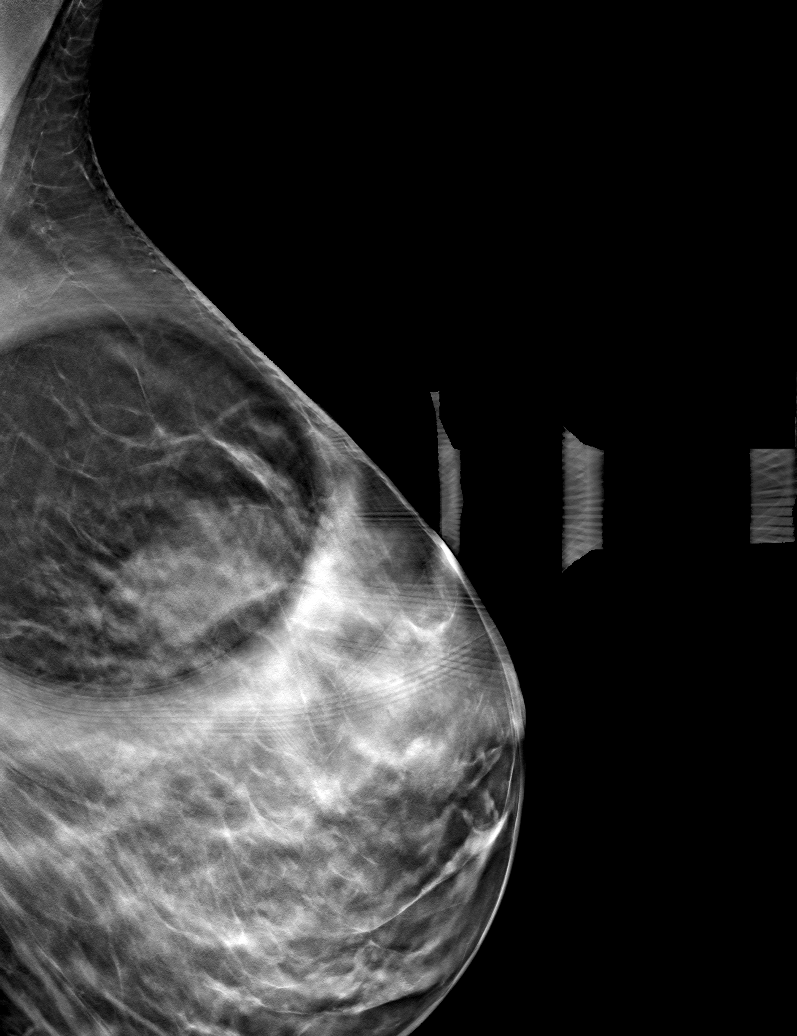

[6 of 18 positions shown; findings below may reference images not displayed]

ACR Breast Density Category c: The breast tissue is heterogeneously
dense, which may obscure small masses.
FINDINGS: Additional imaging of the left breast was performed. There is
persistence of a developing asymmetry in the upper-outer quadrant of
the breast. There are no malignant type microcalcifications.

Mammographic images were processed with CAD.

On physical exam, I do not palpate a mass in the upper-outer
quadrant of the left breast.

Targeted ultrasound is performed, showing normal tissue in the
upper-outer quadrant of the left breast. No solid or cystic mass,
abnormal shadowing or distortion visualized. Sonographic evaluation
of the left axilla does not show any enlarged adenopathy.
IMPRESSION: Indeterminate developing asymmetry in the upper-outer quadrant of
the left breast.

RECOMMENDATION:
Stereotactic biopsy of the asymmetry in the left breast is
recommended. The biopsy will be scheduled patient's convenience.

I have discussed the findings and recommendations with the patient.
If applicable, a reminder letter will be sent to the patient
regarding the next appointment.

BI-RADS CATEGORY  4: Suspicious.

## 2021-05-21 DIAGNOSIS — H9201 Otalgia, right ear: Secondary | ICD-10-CM | POA: Diagnosis not present

## 2021-05-21 DIAGNOSIS — J069 Acute upper respiratory infection, unspecified: Secondary | ICD-10-CM | POA: Diagnosis not present

## 2021-05-21 DIAGNOSIS — F17200 Nicotine dependence, unspecified, uncomplicated: Secondary | ICD-10-CM | POA: Diagnosis not present

## 2021-10-30 DIAGNOSIS — Z23 Encounter for immunization: Secondary | ICD-10-CM | POA: Diagnosis not present

## 2021-10-30 DIAGNOSIS — Z0000 Encounter for general adult medical examination without abnormal findings: Secondary | ICD-10-CM | POA: Diagnosis not present

## 2021-10-30 DIAGNOSIS — Z13220 Encounter for screening for lipoid disorders: Secondary | ICD-10-CM | POA: Diagnosis not present

## 2021-10-30 DIAGNOSIS — Z131 Encounter for screening for diabetes mellitus: Secondary | ICD-10-CM | POA: Diagnosis not present

## 2021-10-30 DIAGNOSIS — R5383 Other fatigue: Secondary | ICD-10-CM | POA: Diagnosis not present

## 2021-12-12 DIAGNOSIS — J069 Acute upper respiratory infection, unspecified: Secondary | ICD-10-CM | POA: Diagnosis not present

## 2021-12-12 DIAGNOSIS — B9689 Other specified bacterial agents as the cause of diseases classified elsewhere: Secondary | ICD-10-CM | POA: Diagnosis not present

## 2022-03-15 DIAGNOSIS — B356 Tinea cruris: Secondary | ICD-10-CM | POA: Diagnosis not present

## 2022-03-15 DIAGNOSIS — R8290 Unspecified abnormal findings in urine: Secondary | ICD-10-CM | POA: Diagnosis not present

## 2022-12-06 DIAGNOSIS — Z23 Encounter for immunization: Secondary | ICD-10-CM | POA: Diagnosis not present

## 2022-12-06 DIAGNOSIS — F17200 Nicotine dependence, unspecified, uncomplicated: Secondary | ICD-10-CM | POA: Diagnosis not present

## 2022-12-06 DIAGNOSIS — E7800 Pure hypercholesterolemia, unspecified: Secondary | ICD-10-CM | POA: Diagnosis not present

## 2022-12-06 DIAGNOSIS — Z0000 Encounter for general adult medical examination without abnormal findings: Secondary | ICD-10-CM | POA: Diagnosis not present

## 2022-12-06 DIAGNOSIS — R5383 Other fatigue: Secondary | ICD-10-CM | POA: Diagnosis not present

## 2022-12-30 DIAGNOSIS — R739 Hyperglycemia, unspecified: Secondary | ICD-10-CM | POA: Diagnosis not present

## 2023-05-07 DIAGNOSIS — H9202 Otalgia, left ear: Secondary | ICD-10-CM | POA: Diagnosis not present

## 2023-05-09 DIAGNOSIS — R221 Localized swelling, mass and lump, neck: Secondary | ICD-10-CM | POA: Diagnosis not present

## 2023-05-09 DIAGNOSIS — H9202 Otalgia, left ear: Secondary | ICD-10-CM | POA: Diagnosis not present

## 2023-05-12 ENCOUNTER — Other Ambulatory Visit: Payer: Self-pay | Admitting: Physician Assistant

## 2023-05-12 DIAGNOSIS — R221 Localized swelling, mass and lump, neck: Secondary | ICD-10-CM

## 2023-05-25 ENCOUNTER — Ambulatory Visit
Admission: RE | Admit: 2023-05-25 | Discharge: 2023-05-25 | Disposition: A | Discharge status: Home / Self Care | Payer: BC Managed Care – PPO | Source: Ambulatory Visit | Attending: Physician Assistant | Admitting: Physician Assistant

## 2023-05-25 DIAGNOSIS — R221 Localized swelling, mass and lump, neck: Secondary | ICD-10-CM | POA: Diagnosis not present

## 2023-05-30 ENCOUNTER — Other Ambulatory Visit: Payer: Self-pay | Admitting: Physician Assistant

## 2023-05-30 DIAGNOSIS — E041 Nontoxic single thyroid nodule: Secondary | ICD-10-CM

## 2023-05-30 DIAGNOSIS — R221 Localized swelling, mass and lump, neck: Secondary | ICD-10-CM

## 2023-06-17 ENCOUNTER — Ambulatory Visit
Admission: RE | Admit: 2023-06-17 | Discharge: 2023-06-17 | Disposition: A | Discharge status: Home / Self Care | Payer: BC Managed Care – PPO | Source: Ambulatory Visit | Attending: Physician Assistant | Admitting: Physician Assistant

## 2023-06-17 DIAGNOSIS — E042 Nontoxic multinodular goiter: Secondary | ICD-10-CM | POA: Diagnosis not present

## 2023-06-17 DIAGNOSIS — R221 Localized swelling, mass and lump, neck: Secondary | ICD-10-CM

## 2023-06-22 ENCOUNTER — Other Ambulatory Visit: Payer: BC Managed Care – PPO

## 2023-07-06 ENCOUNTER — Ambulatory Visit
Admission: RE | Admit: 2023-07-06 | Discharge: 2023-07-06 | Disposition: A | Discharge status: Home / Self Care | Payer: BC Managed Care – PPO | Source: Ambulatory Visit | Attending: Physician Assistant | Admitting: Physician Assistant

## 2023-07-06 ENCOUNTER — Other Ambulatory Visit (HOSPITAL_COMMUNITY)
Admission: RE | Admit: 2023-07-06 | Discharge: 2023-07-06 | Disposition: A | Discharge status: Home / Self Care | Payer: BC Managed Care – PPO | Source: Ambulatory Visit | Attending: Physician Assistant | Admitting: Physician Assistant

## 2023-07-06 DIAGNOSIS — E041 Nontoxic single thyroid nodule: Secondary | ICD-10-CM | POA: Insufficient documentation

## 2023-07-08 LAB — CYTOLOGY - NON PAP

## 2023-12-13 DIAGNOSIS — Z0000 Encounter for general adult medical examination without abnormal findings: Secondary | ICD-10-CM | POA: Diagnosis not present

## 2023-12-13 DIAGNOSIS — F17200 Nicotine dependence, unspecified, uncomplicated: Secondary | ICD-10-CM | POA: Diagnosis not present

## 2023-12-13 DIAGNOSIS — E7800 Pure hypercholesterolemia, unspecified: Secondary | ICD-10-CM | POA: Diagnosis not present

## 2023-12-13 DIAGNOSIS — F419 Anxiety disorder, unspecified: Secondary | ICD-10-CM | POA: Diagnosis not present

## 2023-12-13 DIAGNOSIS — Z86718 Personal history of other venous thrombosis and embolism: Secondary | ICD-10-CM | POA: Diagnosis not present

## 2023-12-13 DIAGNOSIS — E042 Nontoxic multinodular goiter: Secondary | ICD-10-CM | POA: Diagnosis not present

## 2024-04-12 DIAGNOSIS — Z1231 Encounter for screening mammogram for malignant neoplasm of breast: Secondary | ICD-10-CM | POA: Diagnosis not present

## 2024-04-12 DIAGNOSIS — Z01419 Encounter for gynecological examination (general) (routine) without abnormal findings: Secondary | ICD-10-CM | POA: Diagnosis not present

## 2024-12-26 DIAGNOSIS — Z0000 Encounter for general adult medical examination without abnormal findings: Secondary | ICD-10-CM | POA: Diagnosis not present

## 2024-12-26 DIAGNOSIS — F17210 Nicotine dependence, cigarettes, uncomplicated: Secondary | ICD-10-CM | POA: Diagnosis not present

## 2024-12-26 DIAGNOSIS — E042 Nontoxic multinodular goiter: Secondary | ICD-10-CM | POA: Diagnosis not present

## 2024-12-26 DIAGNOSIS — F419 Anxiety disorder, unspecified: Secondary | ICD-10-CM | POA: Diagnosis not present

## 2024-12-26 DIAGNOSIS — E7800 Pure hypercholesterolemia, unspecified: Secondary | ICD-10-CM | POA: Diagnosis not present

## 2024-12-26 DIAGNOSIS — Z23 Encounter for immunization: Secondary | ICD-10-CM | POA: Diagnosis not present

## 2024-12-26 DIAGNOSIS — Z86718 Personal history of other venous thrombosis and embolism: Secondary | ICD-10-CM | POA: Diagnosis not present
